# Patient Record
Sex: Female | Born: 1985 | Race: Black or African American | Hispanic: No | Marital: Single
Health system: Midwestern US, Community
[De-identification: ages and names within clinical notes are randomized; demographics above are authoritative.]

## PROBLEM LIST (undated history)

## (undated) DIAGNOSIS — I10 Essential (primary) hypertension: Secondary | ICD-10-CM

## (undated) DIAGNOSIS — K219 Gastro-esophageal reflux disease without esophagitis: Secondary | ICD-10-CM

## (undated) DIAGNOSIS — K3184 Gastroparesis: Secondary | ICD-10-CM

## (undated) DIAGNOSIS — K30 Functional dyspepsia: Secondary | ICD-10-CM

## (undated) HISTORY — DX: Functional dyspepsia: K30

---

## 2010-01-31 ENCOUNTER — Other Ambulatory Visit: Admission: RE | Admit: 2010-01-31 | Discharge: 2010-01-31 | Payer: Self-pay | Admitting: *Deleted

## 2010-04-18 ENCOUNTER — Ambulatory Visit: Payer: Self-pay | Admitting: Internal Medicine

## 2010-04-18 DIAGNOSIS — R030 Elevated blood-pressure reading, without diagnosis of hypertension: Secondary | ICD-10-CM

## 2010-04-18 DIAGNOSIS — R358 Other polyuria: Secondary | ICD-10-CM

## 2010-04-18 DIAGNOSIS — K219 Gastro-esophageal reflux disease without esophagitis: Secondary | ICD-10-CM | POA: Insufficient documentation

## 2010-04-18 LAB — CONVERTED CEMR LAB
Blood Glucose, Fingerstick: 130
Nitrite: NEGATIVE
Specific Gravity, Urine: 1.015
Urobilinogen, UA: 0.2
WBC Urine, dipstick: NEGATIVE

## 2010-07-11 NOTE — Assessment & Plan Note (Signed)
Summary: NEW PT EST // RS   Vital Signs:  Patient profile:   25 year old female Height:      64 inches Weight:      148 pounds BMI:     25.50 Temp:     98.3 degrees F oral BP sitting:   124 / 76  (left arm) Cuff size:   regular  Vitals Entered By: Duard Brady LPN (April 18, 2010 2:54 PM) CC: new to establish -- HTN concerns  Is Patient Diabetic? No CBG Result 130  Flu Vaccine Consent Questions     Do you have a history of severe allergic reactions to this vaccine? no    Any prior history of allergic reactions to egg and/or gelatin? no    Do you have a sensitivity to the preservative Thimersol? no    Do you have a past history of Guillan-Barre Syndrome? no    Do you currently have an acute febrile illness? no    Have you ever had a severe reaction to latex? no    Vaccine information given and explained to patient? yes    Are you currently pregnant? no    Lot Number:[id number]   Exp Date:12/09/2010   Site Given  Left Deltoid IM  CC:  new to establish -- HTN concerns .  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  He says been present for a few weeks.  She saw another physician 5 days ago, and a fasting blood sugar was 143.  She was given sampler Benicar due to a blood pressure of 170/120.  She took a single dose and has a monitoring of blood pressure readings.  There are somewhat labile, but often in a normal range.  She continues to have excessive thirst and frequency, including nocturia.  She is consuming considerable fluids due to excessive thirst.  No caffeinated products  Preventive Screening-Counseling & Management  Alcohol-Tobacco     Smoking Status: current  Allergies (verified): 1)  ! Keflex  Past History:  Past Medical History: rule out hypertension GERD history of UTI  Past Surgical History: gravida one, para zero, abortus 1, 2007  Family  History: Reviewed history and no changes required. father, age 30, hypertension mother age 78, hypertension, hypercholesterolemia maternal grandmother with diabetes one half brother in good health 4 sisters, one with sickle cell disease  Social History: Reviewed history and no changes required. 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    Physical Exam  General:  overweight-appearing.  blood pressure of 140/80 Head:  Normocephalic and atraumatic without obvious abnormalities. No apparent alopecia or balding. Eyes:  No corneal or conjunctival inflammation noted. EOMI. Perrla. Funduscopic exam benign, without hemorrhages, exudates or papilledema. Vision grossly normal. Ears:  External ear exam shows no significant lesions or deformities.  Otoscopic examination reveals clear canals, tympanic membranes are intact bilaterally without bulging, retraction, inflammation or discharge. Hearing is grossly normal bilaterally. Mouth:  Oral mucosa and oropharynx without lesions or exudates.  Teeth in good repair. Neck:  No deformities, masses, or tenderness noted. Lungs:  Normal respiratory effort, chest expands symmetrically. Lungs are clear to auscultation, no crackles or wheezes. Heart:  Normal rate and regular rhythm. S1 and S2 normal without gallop, murmur, click, rub or other extra sounds. Abdomen:  Bowel sounds  positive,abdomen soft and non-tender without masses, organomegaly or hernias noted. Msk:  No deformity or scoliosis noted of thoracic or lumbar spine.   Pulses:  R and L carotid,radial,femoral,dorsalis pedis and posterior tibial pulses are full and equal bilaterally Extremities:  No clubbing, cyanosis, edema, or deformity noted with normal full range of motion of all joints.   Skin:  Intact without suspicious lesions or rashes Cervical Nodes:  No lymphadenopathy noted Axillary Nodes:  No palpable lymphadenopathy Inguinal Nodes:  No significant adenopathy Psych:  Cognition and judgment appear intact. Alert and cooperative with normal attention span and concentration. No apparent delusions, illusions, hallucinations   Impression & Recommendations:  Problem # 1:  POLYURIA (GMW-102.72)  Orders: UA Dipstick W/ Micro (manual) (53664)  Problem # 2:  ELEVATED BLOOD PRESSURE WITHOUT DIAGNOSIS OF HYPERTENSION (ICD-796.2)  Complete Medication List: 1)  Ambien 10 Mg Tabs (Zolpidem tartrate) .... At bedtime prn  Other Orders: Admin 1st Vaccine (40347) Flu Vaccine 36yrs + (42595) Capillary Blood Glucose/CBG (63875)  Patient Instructions: 1)  moderate fluid intake 2)  Limit your Sodium (Salt). 3)  avoid caffeinated beverages 4)  Please schedule a follow-up appointment in 1 month. Prescriptions: AMBIEN 10 MG TABS (ZOLPIDEM TARTRATE) at bedtime prn  #30 x 2   Entered and Authorized by:   Gordy Savers  MD   Signed by:   Gordy Savers  MD on 04/18/2010   Method used:   Print then Give to Patient   RxID:   [id number]    Orders Added: 1)  Admin 1st Vaccine [90471] 2)  Flu Vaccine 49yrs + [30160] 3)  New Patient 18-39 years [99385] 4)  Capillary Blood Glucose/CBG [10932] 5)  UA Dipstick W/ Micro (manual) [81000]    Laboratory Results   Urine Tests  Date/Time Received: April 18, 2010 4:38 PM  Date/Time Reported: April 18, 2010 4:38 PM   Routine Urinalysis     Color: lt. yellow Appearance: Clear Glucose: negative   (Normal Range: Negative) Bilirubin: negative   (Normal Range: Negative) Ketone: negative   (Normal Range: Negative) Spec. Gravity: 1.015   (Normal Range: 1.003-1.035) Blood: trace-lysed   (Normal Range: Negative) pH: 5.0   (Normal Range: 5.0-8.0) Protein: trace   (Normal Range: Negative) Urobilinogen: 0.2   (Normal Range: 0-1) Nitrite: negative   (Normal Range: Negative) Leukocyte Esterace: negative   (Normal Range: Negative)     Blood Tests     CBG Random:: 130mg /dL

## 2011-02-15 ENCOUNTER — Other Ambulatory Visit: Payer: Self-pay | Admitting: Internal Medicine

## 2011-09-23 ENCOUNTER — Other Ambulatory Visit: Payer: Self-pay | Admitting: Internal Medicine

## 2013-01-03 ENCOUNTER — Encounter (HOSPITAL_COMMUNITY): Payer: Self-pay | Admitting: *Deleted

## 2013-01-03 ENCOUNTER — Emergency Department (HOSPITAL_COMMUNITY)
Admission: EM | Admit: 2013-01-03 | Discharge: 2013-01-03 | Disposition: A | Discharge status: Home / Self Care | Payer: Managed Care, Other (non HMO) | Attending: Emergency Medicine | Admitting: Emergency Medicine

## 2013-01-03 DIAGNOSIS — Z880 Allergy status to penicillin: Secondary | ICD-10-CM | POA: Insufficient documentation

## 2013-01-03 DIAGNOSIS — I10 Essential (primary) hypertension: Secondary | ICD-10-CM | POA: Insufficient documentation

## 2013-01-03 DIAGNOSIS — Z3202 Encounter for pregnancy test, result negative: Secondary | ICD-10-CM | POA: Insufficient documentation

## 2013-01-03 DIAGNOSIS — R112 Nausea with vomiting, unspecified: Secondary | ICD-10-CM | POA: Insufficient documentation

## 2013-01-03 DIAGNOSIS — R1013 Epigastric pain: Secondary | ICD-10-CM | POA: Insufficient documentation

## 2013-01-03 DIAGNOSIS — R630 Anorexia: Secondary | ICD-10-CM | POA: Insufficient documentation

## 2013-01-03 DIAGNOSIS — K219 Gastro-esophageal reflux disease without esophagitis: Secondary | ICD-10-CM | POA: Insufficient documentation

## 2013-01-03 DIAGNOSIS — Z87891 Personal history of nicotine dependence: Secondary | ICD-10-CM | POA: Insufficient documentation

## 2013-01-03 HISTORY — DX: Essential (primary) hypertension: I10

## 2013-01-03 HISTORY — DX: Gastro-esophageal reflux disease without esophagitis: K21.9

## 2013-01-03 LAB — COMPREHENSIVE METABOLIC PANEL
AST: 21 U/L (ref 0–37)
Albumin: 4.5 g/dL (ref 3.5–5.2)
Calcium: 9.8 mg/dL (ref 8.4–10.5)
Creatinine, Ser: 0.64 mg/dL (ref 0.50–1.10)
GFR calc non Af Amer: >90 mL/min (ref >90)
Total Protein: 7.6 g/dL (ref 6.0–8.3)

## 2013-01-03 LAB — POCT PREGNANCY, URINE: Preg Test, Ur: NEGATIVE

## 2013-01-03 LAB — URINALYSIS, ROUTINE W REFLEX MICROSCOPIC
Glucose, UA: NEGATIVE mg/dL
Leukocytes, UA: NEGATIVE
Protein, ur: NEGATIVE mg/dL
Specific Gravity, Urine: 1.02 (ref 1.005–1.030)
pH: 8.5 — ABNORMAL HIGH (ref 5.0–8.0)

## 2013-01-03 LAB — CBC WITH DIFFERENTIAL/PLATELET
Basophils Absolute: 0 10*3/uL (ref 0.0–0.1)
Basophils Relative: 1 % (ref 0–1)
Eosinophils Absolute: 0 10*3/uL (ref 0.0–0.7)
Eosinophils Relative: 0 % (ref 0–5)
HCT: 42 % (ref 36.0–46.0)
MCHC: 33.8 g/dL (ref 30.0–36.0)
MCV: 84.5 fL (ref 78.0–100.0)
Monocytes Absolute: 0.3 10*3/uL (ref 0.1–1.0)
Platelets: 279 10*3/uL (ref 150–400)
RDW: 13 % (ref 11.5–15.5)

## 2013-01-03 MED ORDER — OMEPRAZOLE 20 MG PO CPDR: 20.0000 mg | DELAYED_RELEASE_CAPSULE | Freq: Every day | ORAL | Status: DC

## 2013-01-03 MED ORDER — HYDROMORPHONE HCL PF 1 MG/ML IJ SOLN
1.0000 mg | Freq: Once | INTRAMUSCULAR | Status: AC
  Administered 2013-01-03: 1 mg via INTRAVENOUS
  Filled 2013-01-03: qty 1

## 2013-01-03 MED ORDER — ONDANSETRON HCL 4 MG/2ML IJ SOLN
4.0000 mg | Freq: Once | INTRAMUSCULAR | Status: AC
  Administered 2013-01-03: 4 mg via INTRAVENOUS
  Filled 2013-01-03: qty 2

## 2013-01-03 MED ORDER — PANTOPRAZOLE SODIUM 40 MG PO TBEC
40.0000 mg | DELAYED_RELEASE_TABLET | Freq: Once | ORAL | Status: AC
  Administered 2013-01-03: 40 mg via ORAL
  Filled 2013-01-03: qty 1

## 2013-01-03 MED ORDER — SODIUM CHLORIDE 0.9 % IV SOLN
1000.0000 mL | INTRAVENOUS | Status: DC
  Administered 2013-01-03: 1000 mL via INTRAVENOUS

## 2013-01-03 MED ORDER — ONDANSETRON HCL 8 MG PO TABS: 4.0000 mg | ORAL_TABLET | ORAL | Status: DC | PRN

## 2013-01-03 NOTE — ED Provider Notes (Signed)
CSN: [id number]     Arrival date & time 01/03/13  1507 History     First MD Initiated Contact with Patient 01/03/13 1628     Chief Complaint  Patient presents with  . Abdominal Pain   (Consider location/radiation/quality/duration/timing/severity/associated sxs/prior Treatment) Patient is a 27 y.o. female presenting with abdominal pain. The history is provided by the patient, a friend and medical records. No language interpreter was used.  Abdominal Pain Associated symptoms include abdominal pain, nausea and vomiting. Pertinent negatives include no chest pain, coughing, diaphoresis, fatigue, fever, neck pain, rash or weakness.    Karen Herrera is a 27 y.o. female  with a hx of GERD and HTN on no home medications presents to the Emergency Department complaining of gradual, persistent, progressively worsening sharp epigastric abdominal pain with associated nausea and vomiting beginning yesterday morning. Pt states she has had several episodes like this in the past, but none as bad as the one she is currently having.  Pt denies any correlation with her menses.  LMP was 2 weeks ago. Associated symptoms include decreased appetite and PO intake.  She has not attempted any home medications, but nothing seems to make it better or worse.  Pt denies fever, chills, headache, neck pain, chest pain, SOB, diarrhea, weakness, dizziness, syncope, dysuria, hematuria.     Past Medical History  Diagnosis Date  . GERD (gastroesophageal reflux disease)   . Hypertension    History reviewed. No pertinent past surgical history. No family history on file. History  Substance Use Topics  . Smoking status: Former Smoker    Quit date: 06/22/2011  . Smokeless tobacco: Not on file  . Alcohol Use: Yes   OB History   Grav Para Term Preterm Abortions TAB SAB Ect Mult Living                 Review of Systems  Constitutional: Negative for fever, diaphoresis, appetite change, fatigue and unexpected weight change.    HENT: Negative for mouth sores, trouble swallowing, neck pain and neck stiffness.   Respiratory: Negative for cough, chest tightness, shortness of breath, wheezing and stridor.   Cardiovascular: Negative for chest pain and palpitations.  Gastrointestinal: Positive for nausea, vomiting and abdominal pain. Negative for diarrhea, constipation, blood in stool, abdominal distention and rectal pain.  Genitourinary: Negative for dysuria, urgency, frequency, hematuria, flank pain and difficulty urinating.  Musculoskeletal: Negative for back pain.  Skin: Negative for rash.  Neurological: Negative for weakness.  Hematological: Negative for adenopathy.  Psychiatric/Behavioral: Negative for confusion.  All other systems reviewed and are negative.    Allergies  Cephalexin  Home Medications   Current Outpatient Rx  Name  Route  Sig  Dispense  Refill  . omeprazole (PRILOSEC) 20 MG capsule   Oral   Take 1 capsule (20 mg total) by mouth daily.   30 capsule   0   . ondansetron (ZOFRAN) 8 MG tablet   Oral   Take 0.5 tablets (4 mg total) by mouth every 4 (four) hours as needed for nausea.   10 tablet   0    BP 154/100  Pulse 83  Temp(Src) 98.7 F (37.1 C) (Oral)  Resp 18  SpO2 99%  LMP 12/20/2012 Physical Exam  Nursing note and vitals reviewed. Constitutional: She is oriented to person, place, and time. She appears well-developed and well-nourished.  Patient vomiting throughout most of exam  HENT:  Head: Normocephalic and atraumatic.  Right Ear: Tympanic membrane, external ear and ear canal normal.  Left Ear: Tympanic membrane, external ear and ear canal normal.  Nose: Nose normal. No mucosal edema or rhinorrhea.  Mouth/Throat: Uvula is midline, oropharynx is clear and moist and mucous membranes are normal. Mucous membranes are not dry. No edematous. No oropharyngeal exudate, posterior oropharyngeal edema, posterior oropharyngeal erythema or tonsillar abscesses.  Eyes: Conjunctivae  are normal. Pupils are equal, round, and reactive to light. No scleral icterus.  Neck: Normal range of motion. Neck supple.  Cardiovascular: Normal rate, regular rhythm, normal heart sounds and intact distal pulses.   No murmur heard. Pulmonary/Chest: Effort normal and breath sounds normal. No respiratory distress. She has no wheezes. She has no rales. She exhibits no tenderness.  Abdominal: Soft. Bowel sounds are normal. She exhibits no distension and no mass. There is tenderness in the epigastric area. There is guarding. There is no rigidity, no rebound, no CVA tenderness and negative Murphy's sign.    No tenderness to RUQ; negative Murphy's sign  Musculoskeletal: Normal range of motion. She exhibits no edema and no tenderness.  Lymphadenopathy:    She has no cervical adenopathy.  Neurological: She is alert and oriented to person, place, and time. She exhibits normal muscle tone. Coordination normal.  Skin: Skin is warm and dry. No erythema.  Psychiatric: She has a normal mood and affect.    ED Course   Procedures (including critical care time)  Labs Reviewed  COMPREHENSIVE METABOLIC PANEL - Abnormal; Notable for the following:    Glucose, Bld 114 (*)    All other components within normal limits  URINALYSIS, ROUTINE W REFLEX MICROSCOPIC - Abnormal; Notable for the following:    APPearance CLOUDY (*)    pH 8.5 (*)    Ketones, ur 40 (*)    All other components within normal limits  CBC WITH DIFFERENTIAL  LIPASE, BLOOD  POCT PREGNANCY, URINE   No results found. 1. Epigastric pain   2. GERD     MDM  Karen Herrera presents with epigastric abd pain, N/V.  Exam with significant tenderness to the epigastrium on initial exam, though somewhat limited 2/2 persistent vomiting.  Will obtain labs and treat symptoms.  7:06 PM Pt with complete resolution of abd pain, nausea and vomiting after administration of zofran and dilaudid. Patient states she feels back to normal.  Patient abdomen  soft and nontender on reevaluation. Patient given protonix to treat for possible PUD.  Patient is nontoxic, nonseptic appearing, in no apparent distress.  Patient's pain and other symptoms adequately managed in emergency department.  Fluid bolus given.  Labs and vitals reviewed.  Patient does not meet the SIRS or Sepsis criteria.  On repeat exam patient does not have a surgical abdomin and there are no peritoneal signs.  No indication of appendicitis, bowel obstruction, bowel perforation, cholecystitis, diverticulitis or ectopic pregnancy.  Patient discharged home with symptomatic treatment and given strict instructions for follow-up with their primary care physician and gastroenterology.  I have also discussed reasons to return immediately to the ER.  Patient expresses understanding and agrees with plan.     Dierdre Forth, PA-C 01/03/13 1908

## 2013-01-03 NOTE — ED Notes (Signed)
Pt reports having abdominal pain with nausea and vomiting since yesterday. Pt reports getting episodes of n/v, and abd.pain every few weeks.

## 2013-01-04 ENCOUNTER — Encounter (HOSPITAL_COMMUNITY): Payer: Self-pay | Admitting: Emergency Medicine

## 2013-01-04 ENCOUNTER — Emergency Department (HOSPITAL_COMMUNITY)
Admission: EM | Admit: 2013-01-04 | Discharge: 2013-01-04 | Disposition: A | Discharge status: Home / Self Care | Payer: Managed Care, Other (non HMO) | Attending: Emergency Medicine | Admitting: Emergency Medicine

## 2013-01-04 ENCOUNTER — Emergency Department (HOSPITAL_COMMUNITY): Payer: Managed Care, Other (non HMO)

## 2013-01-04 DIAGNOSIS — N83209 Unspecified ovarian cyst, unspecified side: Secondary | ICD-10-CM | POA: Insufficient documentation

## 2013-01-04 DIAGNOSIS — K219 Gastro-esophageal reflux disease without esophagitis: Secondary | ICD-10-CM | POA: Insufficient documentation

## 2013-01-04 DIAGNOSIS — Z79899 Other long term (current) drug therapy: Secondary | ICD-10-CM | POA: Insufficient documentation

## 2013-01-04 DIAGNOSIS — Z87891 Personal history of nicotine dependence: Secondary | ICD-10-CM | POA: Insufficient documentation

## 2013-01-04 DIAGNOSIS — R1013 Epigastric pain: Secondary | ICD-10-CM | POA: Insufficient documentation

## 2013-01-04 DIAGNOSIS — R112 Nausea with vomiting, unspecified: Secondary | ICD-10-CM | POA: Insufficient documentation

## 2013-01-04 DIAGNOSIS — I10 Essential (primary) hypertension: Secondary | ICD-10-CM | POA: Insufficient documentation

## 2013-01-04 DIAGNOSIS — R079 Chest pain, unspecified: Secondary | ICD-10-CM | POA: Insufficient documentation

## 2013-01-04 LAB — COMPREHENSIVE METABOLIC PANEL
ALT: 12 U/L (ref 0–35)
Albumin: 4.6 g/dL (ref 3.5–5.2)
Alkaline Phosphatase: 67 U/L (ref 39–117)
Glucose, Bld: 107 mg/dL — ABNORMAL HIGH (ref 70–99)
Potassium: 3.2 mEq/L — ABNORMAL LOW (ref 3.5–5.1)
Sodium: 136 mEq/L (ref 135–145)
Total Protein: 7.6 g/dL (ref 6.0–8.3)

## 2013-01-04 LAB — CBC
Hemoglobin: 14.1 g/dL (ref 12.0–15.0)
MCHC: 33.7 g/dL (ref 30.0–36.0)
RDW: 12.8 % (ref 11.5–15.5)

## 2013-01-04 MED ORDER — DIPHENHYDRAMINE HCL 50 MG/ML IJ SOLN
25.0000 mg | Freq: Once | INTRAMUSCULAR | Status: AC
  Administered 2013-01-04: 25 mg via INTRAVENOUS
  Filled 2013-01-04: qty 1

## 2013-01-04 MED ORDER — HYDROMORPHONE HCL PF 1 MG/ML IJ SOLN
1.0000 mg | Freq: Once | INTRAMUSCULAR | Status: AC
  Administered 2013-01-04: 1 mg via INTRAVENOUS
  Filled 2013-01-04: qty 1

## 2013-01-04 MED ORDER — SODIUM CHLORIDE 0.9 % IV BOLUS (SEPSIS)
1000.0000 mL | Freq: Once | INTRAVENOUS | Status: AC
  Administered 2013-01-04: 1000 mL via INTRAVENOUS

## 2013-01-04 MED ORDER — POTASSIUM CHLORIDE CRYS ER 20 MEQ PO TBCR
40.0000 meq | EXTENDED_RELEASE_TABLET | Freq: Once | ORAL | Status: AC
  Administered 2013-01-04: 40 meq via ORAL
  Filled 2013-01-04: qty 2

## 2013-01-04 MED ORDER — ONDANSETRON 8 MG PO TBDP: ORAL_TABLET | ORAL | Status: DC

## 2013-01-04 MED ORDER — OXYCODONE-ACETAMINOPHEN 5-325 MG PO TABS: 1.0000 | ORAL_TABLET | ORAL | Status: DC | PRN

## 2013-01-04 MED ORDER — IOHEXOL 300 MG/ML  SOLN
100.0000 mL | Freq: Once | INTRAMUSCULAR | Status: AC | PRN
  Administered 2013-01-04: 100 mL via INTRAVENOUS

## 2013-01-04 MED ORDER — METOCLOPRAMIDE HCL 5 MG/ML IJ SOLN
10.0000 mg | Freq: Once | INTRAMUSCULAR | Status: AC
  Administered 2013-01-04: 10 mg via INTRAVENOUS
  Filled 2013-01-04: qty 2

## 2013-01-04 MED ORDER — ONDANSETRON HCL 4 MG/2ML IJ SOLN
4.0000 mg | Freq: Once | INTRAMUSCULAR | Status: AC
  Administered 2013-01-04: 4 mg via INTRAVENOUS
  Filled 2013-01-04: qty 2

## 2013-01-04 MED ORDER — IOHEXOL 300 MG/ML  SOLN
50.0000 mL | Freq: Once | INTRAMUSCULAR | Status: AC | PRN
  Administered 2013-01-04: 50 mL via ORAL

## 2013-01-04 NOTE — ED Notes (Signed)
Medications ordered after pt placed for discharged.

## 2013-01-04 NOTE — ED Provider Notes (Signed)
Medical screening examination/treatment/procedure(s) were performed by non-physician practitioner and as supervising physician I was immediately available for consultation/collaboration.  Hurman Horn, MD 01/04/13 1155

## 2013-01-04 NOTE — ED Provider Notes (Signed)
CSN: [id number]     Arrival date & time 01/04/13  1436 History     First MD Initiated Contact with Patient 01/04/13 1510     Chief Complaint  Patient presents with  . Abdominal Pain   (Consider location/radiation/quality/duration/timing/severity/associated sxs/prior Treatment) The history is provided by the patient and medical records. No language interpreter was used.    Karen Herrera is a 27 y.o. female  with a hx of HTN, GERD presents to the Emergency Department complaining of gradual, persistent, progressively worsening epigastric pain recurring again this morning with assocaited N/V and radiating into the chest.  Pt seen last night for similar symptoms which resolved with fluid, pain medication.  Labs unremarkable at that time.  No CT last night due to quick and complete resolution of pain with toleration of PO for several hours prior to discharge.  Pt states no return of symptoms last night.   Associated symptoms include nausea and vomiting without diarrhea.  Nothing makes it better including the zofran and prevacid tried this morning and eating makes it worse.  Pt denies fever, chills, headache, neck pain, shortness of breath, diarrhea, weakness, dizziness, syncope, dysuria.     Past Medical History  Diagnosis Date  . GERD (gastroesophageal reflux disease)   . Hypertension    History reviewed. No pertinent past surgical history. History reviewed. No pertinent family history. History  Substance Use Topics  . Smoking status: Former Smoker    Quit date: 06/22/2011  . Smokeless tobacco: Not on file  . Alcohol Use: Yes   OB History   Grav Para Term Preterm Abortions TAB SAB Ect Mult Living                 Review of Systems  Constitutional: Negative for fever, diaphoresis, appetite change, fatigue and unexpected weight change.  HENT: Negative for mouth sores and neck stiffness.   Eyes: Negative for visual disturbance.  Respiratory: Negative for cough, chest tightness, shortness  of breath and wheezing.   Cardiovascular: Positive for chest pain.  Gastrointestinal: Positive for nausea, vomiting and abdominal pain. Negative for diarrhea and constipation.  Endocrine: Negative for polydipsia, polyphagia and polyuria.  Genitourinary: Negative for dysuria, urgency, frequency and hematuria.  Musculoskeletal: Negative for back pain.  Skin: Negative for rash.  Allergic/Immunologic: Negative for immunocompromised state.  Neurological: Negative for syncope, light-headedness and headaches.  Hematological: Does not bruise/bleed easily.  Psychiatric/Behavioral: Negative for sleep disturbance. The patient is not nervous/anxious.     Allergies  Cephalexin  Home Medications   Current Outpatient Rx  Name  Route  Sig  Dispense  Refill  . omeprazole (PRILOSEC) 20 MG capsule   Oral   Take 1 capsule (20 mg total) by mouth daily.   30 capsule   0   . ondansetron (ZOFRAN) 8 MG tablet   Oral   Take 0.5 tablets (4 mg total) by mouth every 4 (four) hours as needed for nausea.   10 tablet   0   . ondansetron (ZOFRAN ODT) 8 MG disintegrating tablet      8mg  ODT q4 hours prn nausea   10 tablet   0   . oxyCODONE-acetaminophen (PERCOCET/ROXICET) 5-325 MG per tablet   Oral   Take 1-2 tablets by mouth every 4 (four) hours as needed for pain.   15 tablet   0    BP 128/70  Pulse 76  Temp(Src) 98.5 F (36.9 C) (Oral)  Resp 20  SpO2 100%  LMP 12/20/2012 Physical Exam  Nursing  note and vitals reviewed. Constitutional: She appears well-developed and well-nourished.  HENT:  Head: Normocephalic and atraumatic.  Mouth/Throat: Oropharynx is clear and moist.  Eyes: Conjunctivae are normal. Pupils are equal, round, and reactive to light. No scleral icterus.  Neck: Normal range of motion.  Cardiovascular: Normal rate, regular rhythm, normal heart sounds and intact distal pulses.   No murmur heard. Pulmonary/Chest: Effort normal and breath sounds normal. No respiratory  distress. She has no wheezes.  Abdominal: Soft. Normal appearance and bowel sounds are normal. She exhibits no distension, no pulsatile midline mass and no mass. There is tenderness in the epigastric area. There is guarding. There is no rigidity, no rebound, no CVA tenderness and negative Murphy's sign.    Lymphadenopathy:    She has no cervical adenopathy.  Neurological: She is alert.  Skin: Skin is warm and dry. No erythema.  Psychiatric: She has a normal mood and affect.    ED Course   Procedures (including critical care time)  Labs Reviewed  COMPREHENSIVE METABOLIC PANEL - Abnormal; Notable for the following:    Potassium 3.2 (*)    Glucose, Bld 107 (*)    BUN 5 (*)    All other components within normal limits  CBC  LIPASE, BLOOD  POCT I-STAT TROPONIN I   ECG:  Date: 01/04/2013  Rate: 78  Rhythm: normal sinus rhythm  QRS Axis: normal  Intervals: normal  ST/T Wave abnormalities: normal  Conduction Disutrbances:none  Narrative Interpretation: nonischemic, no old for comparison  Old EKG Reviewed: none available    Ct Abdomen Pelvis W Contrast  01/04/2013   *RADIOLOGY REPORT*  Clinical Data: Progressive epigastric abdominal pain with nausea and vomiting.  CT ABDOMEN AND PELVIS WITH CONTRAST  Technique:  Multidetector CT imaging of the abdomen and pelvis was performed following the standard protocol during bolus administration of intravenous contrast.  Contrast: 50mL OMNIPAQUE IOHEXOL 300 MG/ML  SOLN, OMNIPAQUE IOHEXOL 300 MG/ML  SOLN  Comparison: None.  Findings: There is hepatomegaly with a slight inhomogeneity of the anterior aspect of the left lobe of the liver without focal lesion. Biliary tree is normal.  Spleen, pancreas, adrenal glands, and kidneys are normal except for a 13 mm cyst on the upper pole of the right kidney.  The bowel is normal including the terminal ileum and appendix.  There is a small amount of ascites in the inferior aspect of both pericolic  gutters as well as in the pelvic cul-de-sac.  There is a partially collapsed 18 mm cyst on the right ovary.  This may be the source of the free fluid.  Uterus and left ovary are normal.  No osseous abnormalities.  IMPRESSION:  1.  Hepatomegaly with slight inhomogeneous the liver parenchyma, nonspecific. 2.  Free fluid in the pelvis with a partially collapsed right ovarian cyst.   Original Report Authenticated By: Francene Boyers, M.D.   1. Epigastric abdominal pain   2. Ruptured ovarian cyst     MDM  Inaya Guertin presents to the ED < 24 hours after d/c last night with persistent N/V and epigastric pain.  Pt TTP of the epigastrium specifically and mildly tender throughout without peritoneal signs.  Pt actively vomiting on initial exam.  Will give fluids, antiemetics and pain control.    4:46 PM Pt with cessation of nausea, vomiting and pain.  Mild hypokalemia but otherwise unchanged and unremarkable from last night.  ECG nonischemic and troponin negative.  CT scan pending.   6:50PM CT scan with 18mm  ruptured right ovarian cyst with small amount free fluid in the paracolic gutters and pelvic cul-de-sac.  I personally reviewed the imaging tests through PACS system.  I reviewed available ER/hospitalization records through the EMR.  This is a potential cause for the patient's persistent nausea and vomiting, but unlikely a cause for the epigastric pain.    Patient is nontoxic, nonseptic appearing, in no apparent distress.  Patient's pain and other symptoms adequately managed in emergency department.  Fluid bolus given.  Labs, imaging and vitals reviewed.  Patient does not meet the SIRS or Sepsis criteria.  On repeat exam patient does not have a surgical abdomin and there are no peritoneal signs.  No indication of appendicitis, bowel obstruction, bowel perforation, cholecystitis, diverticulitis or ectopic pregnancy.  Patient discharged home with symptomatic treatment and given strict instructions for follow-up  with their primary care physician, gastroenterology and OB/GYN.  I have also discussed reasons to return immediately to the ER.  Patient expresses understanding and agrees with plan.  Dr. Denton Lank was consulted, evaluated this patient with me and agrees with the plan.        Dahlia Client Loveah Like, PA-C 01/04/13 1937

## 2013-01-04 NOTE — ED Notes (Signed)
Was here yesterday for abdominal pain and nausea and vomiting, nauseated since Saturday, was given prilosec and zofran but unable to keep down, pt crying and moaning

## 2013-01-06 NOTE — ED Provider Notes (Signed)
Medical screening examination/treatment/procedure(s) were performed by non-physician practitioner and as supervising physician I was immediately available for consultation/collaboration.   Suzi Roots, MD 01/06/13 [phone]

## 2013-01-08 ENCOUNTER — Ambulatory Visit: Payer: Managed Care, Other (non HMO) | Admitting: Internal Medicine

## 2013-01-09 HISTORY — PX: UPPER GI ENDOSCOPY: SHX6162

## 2013-01-18 ENCOUNTER — Encounter (HOSPITAL_COMMUNITY): Payer: Self-pay | Admitting: *Deleted

## 2013-01-18 ENCOUNTER — Emergency Department (HOSPITAL_COMMUNITY)
Admission: EM | Admit: 2013-01-18 | Discharge: 2013-01-18 | Disposition: A | Discharge status: Home / Self Care | Payer: Managed Care, Other (non HMO) | Attending: Emergency Medicine | Admitting: Emergency Medicine

## 2013-01-18 ENCOUNTER — Emergency Department (HOSPITAL_COMMUNITY): Payer: Managed Care, Other (non HMO)

## 2013-01-18 DIAGNOSIS — Z3202 Encounter for pregnancy test, result negative: Secondary | ICD-10-CM | POA: Insufficient documentation

## 2013-01-18 DIAGNOSIS — Z79899 Other long term (current) drug therapy: Secondary | ICD-10-CM | POA: Insufficient documentation

## 2013-01-18 DIAGNOSIS — Z87891 Personal history of nicotine dependence: Secondary | ICD-10-CM | POA: Insufficient documentation

## 2013-01-18 DIAGNOSIS — R109 Unspecified abdominal pain: Secondary | ICD-10-CM | POA: Insufficient documentation

## 2013-01-18 DIAGNOSIS — R197 Diarrhea, unspecified: Secondary | ICD-10-CM | POA: Insufficient documentation

## 2013-01-18 DIAGNOSIS — R112 Nausea with vomiting, unspecified: Secondary | ICD-10-CM

## 2013-01-18 DIAGNOSIS — I10 Essential (primary) hypertension: Secondary | ICD-10-CM | POA: Insufficient documentation

## 2013-01-18 DIAGNOSIS — K219 Gastro-esophageal reflux disease without esophagitis: Secondary | ICD-10-CM | POA: Insufficient documentation

## 2013-01-18 DIAGNOSIS — E860 Dehydration: Secondary | ICD-10-CM | POA: Insufficient documentation

## 2013-01-18 LAB — URINALYSIS, ROUTINE W REFLEX MICROSCOPIC
Glucose, UA: NEGATIVE mg/dL
Leukocytes, UA: NEGATIVE
Nitrite: NEGATIVE
Protein, ur: 30 mg/dL — AB
pH: 6 (ref 5.0–8.0)

## 2013-01-18 LAB — CBC
MCH: 28.6 pg (ref 26.0–34.0)
MCHC: 34.3 g/dL (ref 30.0–36.0)
MCV: 83.5 fL (ref 78.0–100.0)
Platelets: 290 10*3/uL (ref 150–400)
RDW: 12.8 % (ref 11.5–15.5)

## 2013-01-18 LAB — URINE MICROSCOPIC-ADD ON

## 2013-01-18 LAB — COMPREHENSIVE METABOLIC PANEL
AST: 19 U/L (ref 0–37)
Albumin: 4.6 g/dL (ref 3.5–5.2)
Calcium: 9.7 mg/dL (ref 8.4–10.5)
Creatinine, Ser: 0.88 mg/dL (ref 0.50–1.10)

## 2013-01-18 LAB — PREGNANCY, URINE: Preg Test, Ur: NEGATIVE

## 2013-01-18 MED ORDER — LORAZEPAM 2 MG/ML IJ SOLN
1.0000 mg | Freq: Once | INTRAMUSCULAR | Status: AC
  Administered 2013-01-18: 1 mg via INTRAVENOUS
  Filled 2013-01-18: qty 1

## 2013-01-18 MED ORDER — SODIUM CHLORIDE 0.9 % IV SOLN
1000.0000 mL | INTRAVENOUS | Status: DC
  Administered 2013-01-18 (×2): 1000 mL via INTRAVENOUS

## 2013-01-18 MED ORDER — SODIUM CHLORIDE 0.9 % IV SOLN
1000.0000 mL | Freq: Once | INTRAVENOUS | Status: AC
  Administered 2013-01-18: 1000 mL via INTRAVENOUS

## 2013-01-18 MED ORDER — METOCLOPRAMIDE HCL 5 MG/ML IJ SOLN
10.0000 mg | Freq: Once | INTRAMUSCULAR | Status: AC
  Administered 2013-01-18: 10 mg via INTRAVENOUS
  Filled 2013-01-18: qty 2

## 2013-01-18 MED ORDER — PROMETHAZINE HCL 25 MG RE SUPP: 25.0000 mg | Freq: Four times a day (QID) | RECTAL | Status: DC | PRN

## 2013-01-18 MED ORDER — OXYCODONE-ACETAMINOPHEN 5-325 MG PO TABS: 1.0000 | ORAL_TABLET | ORAL | Status: DC | PRN

## 2013-01-18 MED ORDER — MORPHINE SULFATE 4 MG/ML IJ SOLN
6.0000 mg | Freq: Once | INTRAMUSCULAR | Status: AC
  Administered 2013-01-18: 6 mg via INTRAVENOUS
  Filled 2013-01-18 (×2): qty 1

## 2013-01-18 MED ORDER — METOCLOPRAMIDE HCL 10 MG PO TABS: 10.0000 mg | ORAL_TABLET | Freq: Four times a day (QID) | ORAL | Status: DC

## 2013-01-18 NOTE — ED Notes (Signed)
Patient acknowledges having HTN and does not take her medications. Instructed to fu with her MD.

## 2013-01-18 NOTE — ED Provider Notes (Addendum)
CSN: [id number]     Arrival date & time 01/18/13  6578 History     First MD Initiated Contact with Patient 01/18/13 [phone]     Chief Complaint  Patient presents with  . Abdominal Pain    HPI  patient reports history of recurrent abdominal pain over the last couple weeks.  She saw her gastroenterologist in [facility] on Friday and was started on dexilant and reports no improvement in her symptoms.  She's continued to have nausea and vomiting with one loose stool today.  She reports her upper abdominal pain is moderate in severity and crampy in nature.  Her chest pain shortness of breath.  No fevers or chills.  No urinary complaints.  No flank pain.  No vaginal complaints.  At this time she still does not have a diagnosis from her gastroenterologist regarding her recurrent upper abdominal pain nausea and vomiting.  She underwent endoscopy on Friday without complications and without a definitive diagnosis.  She reports 3 biopsies were obtained.   Past Medical History  Diagnosis Date  . GERD (gastroesophageal reflux disease)   . Hypertension    History reviewed. No pertinent past surgical history. History reviewed. No pertinent family history. History  Substance Use Topics  . Smoking status: Former Smoker    Quit date: 06/22/2011  . Smokeless tobacco: Not on file  . Alcohol Use: Yes   OB History   Grav Para Term Preterm Abortions TAB SAB Ect Mult Living                 Review of Systems  All other systems reviewed and are negative.    Allergies  Cephalexin  Home Medications   Current Outpatient Rx  Name  Route  Sig  Dispense  Refill  . dexlansoprazole (DEXILANT) 60 MG capsule   Oral   Take 60 mg by mouth daily.         Marland Kitchen omeprazole (PRILOSEC) 20 MG capsule   Oral   Take 1 capsule (20 mg total) by mouth daily.   30 capsule   0   . ondansetron (ZOFRAN) 8 MG tablet   Oral   Take 8 mg by mouth every 8 (eight) hours as needed for nausea.         .  ondansetron (ZOFRAN-ODT) 8 MG disintegrating tablet   Oral   Take 8 mg by mouth every 8 (eight) hours as needed for nausea.         Marland Kitchen oxyCODONE-acetaminophen (PERCOCET/ROXICET) 5-325 MG per tablet   Oral   Take 1-2 tablets by mouth every 4 (four) hours as needed for pain.   15 tablet   0   . promethazine (PHENERGAN) 25 MG tablet   Oral   Take 25 mg by mouth every 6 (six) hours as needed for nausea.         . sucralfate (CARAFATE) 1 GM/10ML suspension   Oral   Take 1 g by mouth 4 (four) times daily.          BP 152/99  Pulse 101  Temp(Src) 99.3 F (37.4 C) (Oral)  Resp 22  SpO2 100%  LMP 12/20/2012 Physical Exam  Nursing note and vitals reviewed. Constitutional: She is oriented to person, place, and time. She appears well-developed and well-nourished. No distress.  HENT:  Head: Normocephalic and atraumatic.  Eyes: EOM are normal.  Neck: Normal range of motion.  Cardiovascular: Normal rate, regular rhythm and normal heart sounds.   Pulmonary/Chest: Effort normal and  breath sounds normal.  Abdominal: Soft. She exhibits no distension.  Upper abdominal tenderness without significant guarding or rebound.  No right upper quadrant tenderness  Musculoskeletal: Normal range of motion.  Neurological: She is alert and oriented to person, place, and time.  Skin: Skin is warm and dry.  Psychiatric: She has a normal mood and affect. Judgment normal.    ED Course   Procedures (including critical care time)  Labs Reviewed  CBC - Abnormal; Notable for the following:    RBC 5.21 (*)    All other components within normal limits  COMPREHENSIVE METABOLIC PANEL - Abnormal; Notable for the following:    Chloride 95 (*)    All other components within normal limits  LIPASE, BLOOD - Abnormal; Notable for the following:    Lipase 117 (*)    All other components within normal limits  URINALYSIS, ROUTINE W REFLEX MICROSCOPIC - Abnormal; Notable for the following:    Color, Urine  AMBER (*)    APPearance CLOUDY (*)    Specific Gravity, Urine 1.039 (*)    Hgb urine dipstick MODERATE (*)    Bilirubin Urine SMALL (*)    Ketones, ur >80 (*)    Protein, ur 30 (*)    All other components within normal limits  URINE MICROSCOPIC-ADD ON - Abnormal; Notable for the following:    Bacteria, UA FEW (*)    All other components within normal limits  PREGNANCY, URINE   No results found. 1. Abdominal pain   2. Nausea & vomiting   3. Dehydration     MDM  12:07 PM Patient feels much better this time.  Discharge home in good condition.  She has nausea medicine at home.  I'll at Reglan as well as suppository Phenergan.  She has pain medications at home.  She does have mild elevation in her lipase today to 117.  This could represent mild pancreatitis but because she feels so much better at this time she'll be sent home with gastroenterology followup.  Instructed her to clear diet for the next 24 hours.  She understands return to the ER for new or worsening symptoms.  A copy of her records including her laboratory studies were given to the patient so that when she follows up with her gastroenterologist he/she will have updated labs  Lyanne Co, MD 01/18/13 1210  1:03 PM At time of discharge the patient had an acute recurrence or upper abdominal pain.  Given her acute severity of pain consideration for cholelithiasis his had given her elevation of lipase.  Ultrasound pending at this time.  I discussed her case with her gastroenterologist at [facility].  She agrees with obtaining the ultrasound at this time.  If her ultrasound demonstrates no acute pathology the patient will followup closely with her gastroenterologist in [place].  Lyanne Co, MD 01/18/13 1304  2:33 PM Patient is feeling better again.  Ultrasound without significant abnormality.  Outpatient followup regarding the nodular irregularities of the left lobe of the liver.   Outpatient gastroenterology followup.  Lyanne Co, MD 01/18/13 [phone]

## 2013-01-18 NOTE — ED Notes (Signed)
Patient throwing up every few hours. 5X just last night. Emesis was yellow now green in color. Last BM was this morning in which she had diarrhea. Constantly nauseous

## 2013-01-18 NOTE — ED Notes (Signed)
Pt reports seen in ED 7/26, 7/27 for same. Abdominal pain, nausea and vomiting x4 days. Pain 10/10. Reports had endoscopy on Friday, waiting for results. Reports phenergan is not helping.

## 2013-01-18 NOTE — ED Notes (Signed)
Patient states she was feeling better. Sleeping intermittently

## 2013-02-19 ENCOUNTER — Encounter: Payer: Self-pay | Admitting: Internal Medicine

## 2013-02-20 ENCOUNTER — Emergency Department (HOSPITAL_COMMUNITY)
Admission: EM | Admit: 2013-02-20 | Discharge: 2013-02-20 | Disposition: A | Discharge status: Home / Self Care | Payer: Managed Care, Other (non HMO) | Attending: Emergency Medicine | Admitting: Emergency Medicine

## 2013-02-20 ENCOUNTER — Encounter (HOSPITAL_COMMUNITY): Payer: Self-pay | Admitting: Family Medicine

## 2013-02-20 DIAGNOSIS — R109 Unspecified abdominal pain: Secondary | ICD-10-CM | POA: Insufficient documentation

## 2013-02-20 DIAGNOSIS — Z3202 Encounter for pregnancy test, result negative: Secondary | ICD-10-CM | POA: Insufficient documentation

## 2013-02-20 DIAGNOSIS — Z87891 Personal history of nicotine dependence: Secondary | ICD-10-CM | POA: Insufficient documentation

## 2013-02-20 DIAGNOSIS — R112 Nausea with vomiting, unspecified: Secondary | ICD-10-CM | POA: Insufficient documentation

## 2013-02-20 DIAGNOSIS — Z79899 Other long term (current) drug therapy: Secondary | ICD-10-CM | POA: Insufficient documentation

## 2013-02-20 DIAGNOSIS — K219 Gastro-esophageal reflux disease without esophagitis: Secondary | ICD-10-CM | POA: Insufficient documentation

## 2013-02-20 DIAGNOSIS — I10 Essential (primary) hypertension: Secondary | ICD-10-CM | POA: Insufficient documentation

## 2013-02-20 LAB — CBC WITH DIFFERENTIAL/PLATELET
Basophils Relative: 0 % (ref 0–1)
Eosinophils Absolute: 0 10*3/uL (ref 0.0–0.7)
Eosinophils Relative: 0 % (ref 0–5)
Hemoglobin: 13.2 g/dL (ref 12.0–15.0)
MCH: 28.8 pg (ref 26.0–34.0)
MCHC: 34.3 g/dL (ref 30.0–36.0)
MCV: 83.9 fL (ref 78.0–100.0)
Monocytes Relative: 6 % (ref 3–12)
Neutrophils Relative %: 68 % (ref 43–77)

## 2013-02-20 LAB — URINALYSIS, ROUTINE W REFLEX MICROSCOPIC
Glucose, UA: NEGATIVE mg/dL
Hgb urine dipstick: NEGATIVE
Ketones, ur: 15 mg/dL — AB
Protein, ur: NEGATIVE mg/dL
Urobilinogen, UA: 0.2 mg/dL (ref 0.0–1.0)

## 2013-02-20 LAB — COMPREHENSIVE METABOLIC PANEL
BUN: 9 mg/dL (ref 6–23)
Calcium: 8.5 mg/dL (ref 8.4–10.5)
GFR calc Af Amer: >90 mL/min (ref >90)
Glucose, Bld: 106 mg/dL — ABNORMAL HIGH (ref 70–99)
Sodium: 135 mEq/L (ref 135–145)
Total Protein: 6.2 g/dL (ref 6.0–8.3)

## 2013-02-20 LAB — LIPASE, BLOOD: Lipase: 62 U/L — ABNORMAL HIGH (ref 11–59)

## 2013-02-20 LAB — POCT PREGNANCY, URINE: Preg Test, Ur: NEGATIVE

## 2013-02-20 MED ORDER — CLONIDINE HCL 0.1 MG PO TABS
0.1000 mg | ORAL_TABLET | Freq: Once | ORAL | Status: AC
Start: 2013-02-20 — End: 2013-02-20
  Administered 2013-02-20: 0.1 mg via ORAL
  Filled 2013-02-20: qty 1

## 2013-02-20 MED ORDER — ONDANSETRON 8 MG PO TBDP
8.0000 mg | ORAL_TABLET | Freq: Once | ORAL | Status: AC
  Administered 2013-02-20: 8 mg via ORAL
  Filled 2013-02-20: qty 1

## 2013-02-20 MED ORDER — SODIUM CHLORIDE 0.9 % IV BOLUS (SEPSIS)
1000.0000 mL | Freq: Once | INTRAVENOUS | Status: AC
  Administered 2013-02-20: 1000 mL via INTRAVENOUS

## 2013-02-20 MED ORDER — DICYCLOMINE HCL 10 MG/ML IM SOLN
20.0000 mg | Freq: Once | INTRAMUSCULAR | Status: AC
  Administered 2013-02-20: 20 mg via INTRAMUSCULAR
  Filled 2013-02-20: qty 2

## 2013-02-20 MED ORDER — METOCLOPRAMIDE HCL 5 MG/ML IJ SOLN
10.0000 mg | Freq: Once | INTRAMUSCULAR | Status: AC
  Administered 2013-02-20: 10 mg via INTRAVENOUS
  Filled 2013-02-20: qty 2

## 2013-02-20 MED ORDER — DIAZEPAM 5 MG/ML IJ SOLN
5.0000 mg | Freq: Once | INTRAMUSCULAR | Status: AC
  Administered 2013-02-20: 5 mg via INTRAVENOUS
  Filled 2013-02-20: qty 2

## 2013-02-20 MED ORDER — PROMETHAZINE HCL 25 MG RE SUPP: 25.0000 mg | Freq: Four times a day (QID) | RECTAL | Status: DC | PRN

## 2013-02-20 NOTE — ED Notes (Signed)
Went to d/c pt. Pt stated she could not see GI and has emesis at home with home meds. PA spoke with pt. Pt to follow up with PCP for different GI MD.

## 2013-02-20 NOTE — ED Notes (Signed)
Patient here for evaluation of abdominal pain and vomiting. States she was seen at [facility] on Wednesday and was referred to GI doc. States that she cannot abdominal pain and nausea. Has been taking Bentyl, Vicodin and Phenergan and Reglan and meds will not stay down. Actively vomiting at triage.

## 2013-02-20 NOTE — ED Provider Notes (Signed)
CSN: [id number]     Arrival date & time 02/20/13  8469 History   First MD Initiated Contact with Patient 02/20/13 [phone]     Chief Complaint  Patient presents with  . Abdominal Pain  . Emesis   HPI  History provided by patient. Patient is a 27 year old female with a history of GERD, "slowed stomach emptying", hypertension and frequent recurrent nausea vomiting who presents with complaints of worsening nausea vomiting symptoms and abdominal pain. Patient has had an ongoing similar problems for years. She has been evaluated in the past by GI specialist with upper endoscopies and stomach emptying studies. She has been told that she has GERD and slowed stomach emptying. Currently she takes Bentyl, Reglan, Phenergan and Percocets for her symptoms. She states she was having some improvement of her symptoms for the past 2 weeks however symptoms returned over the past few days. Denies any changes in medications or diet. She denies any other aggravating or alleviating factors. She denies any associated fever, chills or sweats. Denies any diarrhea symptoms. No urinary complaints. No menstrual changes, vaginal bleeding or vaginal discharge.    Past Medical History  Diagnosis Date  . GERD (gastroesophageal reflux disease)   . Hypertension    History reviewed. No pertinent past surgical history. No family history on file. History  Substance Use Topics  . Smoking status: Former Smoker    Quit date: 06/22/2011  . Smokeless tobacco: Not on file  . Alcohol Use: No   OB History   Grav Para Term Preterm Abortions TAB SAB Ect Mult Living                 Review of Systems  Constitutional: Negative for fever, chills and diaphoresis.  Respiratory: Negative for shortness of breath.   Cardiovascular: Negative for chest pain.  Gastrointestinal: Positive for nausea, vomiting and abdominal pain. Negative for diarrhea and constipation.  Genitourinary: Negative for dysuria, frequency, hematuria, flank pain,  vaginal bleeding and vaginal discharge.  Skin: Negative for rash.  All other systems reviewed and are negative.    Allergies  Cephalexin  Home Medications   Current Outpatient Rx  Name  Route  Sig  Dispense  Refill  . bethanechol (URECHOLINE) 10 MG tablet   Oral   Take 10 mg by mouth 3 (three) times daily.         Marland Kitchen dexlansoprazole (DEXILANT) 60 MG capsule   Oral   Take 60 mg by mouth daily.         . metoCLOPramide (REGLAN) 10 MG tablet   Oral   Take 1 tablet (10 mg total) by mouth every 6 (six) hours.   30 tablet   0   . omeprazole (PRILOSEC) 20 MG capsule   Oral   Take 1 capsule (20 mg total) by mouth daily.   30 capsule   0   . ondansetron (ZOFRAN) 8 MG tablet   Oral   Take 8 mg by mouth every 8 (eight) hours as needed for nausea.         . ondansetron (ZOFRAN-ODT) 8 MG disintegrating tablet   Oral   Take 8 mg by mouth every 8 (eight) hours as needed for nausea.         Marland Kitchen oxyCODONE-acetaminophen (PERCOCET/ROXICET) 5-325 MG per tablet   Oral   Take 1-2 tablets by mouth every 4 (four) hours as needed for pain.   15 tablet   0   . oxyCODONE-acetaminophen (PERCOCET/ROXICET) 5-325 MG per tablet   Oral  Take 1 tablet by mouth every 4 (four) hours as needed for pain.   20 tablet   0   . promethazine (PHENERGAN) 25 MG suppository   Rectal   Place 1 suppository (25 mg total) rectally every 6 (six) hours as needed for nausea.   12 each   0   . promethazine (PHENERGAN) 25 MG tablet   Oral   Take 25 mg by mouth every 6 (six) hours as needed for nausea.         . sucralfate (CARAFATE) 1 GM/10ML suspension   Oral   Take 1 g by mouth 4 (four) times daily.          BP 177/115  Pulse 112  Temp(Src) 98.8 F (37.1 C) (Oral)  Resp 20  Ht 5\' 3"  (1.6 m)  Wt 110 lb (49.896 kg)  BMI 19.49 kg/m2  SpO2 100%  LMP 01/16/2013 Physical Exam  Nursing note and vitals reviewed. Constitutional: She is oriented to person, place, and time. She appears  well-developed and well-nourished. No distress.  HENT:  Head: Normocephalic.  Cardiovascular: Normal rate and regular rhythm.   Pulmonary/Chest: Effort normal and breath sounds normal. No respiratory distress. She has no wheezes. She has no rales.  Abdominal: Soft. There is no tenderness. There is no rebound and no guarding.  No significant abdominal tenderness. No peritoneal signs.  Musculoskeletal: Normal range of motion.  Neurological: She is alert and oriented to person, place, and time.  Skin: Skin is warm and dry. No rash noted.  Psychiatric: She has a normal mood and affect. Her behavior is normal.    ED Course  Procedures   Results for orders placed during the hospital encounter of 02/20/13  CBC WITH DIFFERENTIAL      Result Value Range   WBC 6.2  4.0 - 10.5 K/uL   RBC 4.59  3.87 - 5.11 MIL/uL   Hemoglobin 13.2  12.0 - 15.0 g/dL   HCT 14.7  82.9 - 56.2 %   MCV 83.9  78.0 - 100.0 fL   MCH 28.8  26.0 - 34.0 pg   MCHC 34.3  30.0 - 36.0 g/dL   RDW 13.0  86.5 - 78.4 %   Platelets 307  150 - 400 K/uL   Neutrophils Relative % 68  43 - 77 %   Neutro Abs 4.2  1.7 - 7.7 K/uL   Lymphocytes Relative 26  12 - 46 %   Lymphs Abs 1.6  0.7 - 4.0 K/uL   Monocytes Relative 6  3 - 12 %   Monocytes Absolute 0.4  0.1 - 1.0 K/uL   Eosinophils Relative 0  0 - 5 %   Eosinophils Absolute 0.0  0.0 - 0.7 K/uL   Basophils Relative 0  0 - 1 %   Basophils Absolute 0.0  0.0 - 0.1 K/uL  URINALYSIS, ROUTINE W REFLEX MICROSCOPIC      Result Value Range   Color, Urine YELLOW  YELLOW   APPearance CLEAR  CLEAR   Specific Gravity, Urine 1.034 (*) 1.005 - 1.030   pH 7.0  5.0 - 8.0   Glucose, UA NEGATIVE  NEGATIVE mg/dL   Hgb urine dipstick NEGATIVE  NEGATIVE   Bilirubin Urine SMALL (*) NEGATIVE   Ketones, ur 15 (*) NEGATIVE mg/dL   Protein, ur NEGATIVE  NEGATIVE mg/dL   Urobilinogen, UA 0.2  0.0 - 1.0 mg/dL   Nitrite NEGATIVE  NEGATIVE   Leukocytes, UA NEGATIVE  NEGATIVE  POCT PREGNANCY, URINE  Result Value Range   Preg Test, Ur NEGATIVE  NEGATIVE      Imaging Review No results found.  MDM  No diagnosis found.    Patient seen and evaluated. She appears well no acute distress. She has had similar symptoms for a long period of time. She is holding an emesis bag with some heaving but not producing any vomitus. She did not have any large emesis in triage only small amounts of spitting.  Patient is on narcotics which may be exacerbating her "slowed stomach emptying".  CMP and lipase still pending.  Pt discussed in sign out with Tatyana Kirichenko PA-C.  She will follow labs.   Angus Seller, PA-C 02/20/13 [phone]

## 2013-02-20 NOTE — ED Notes (Signed)
Pt had a little sprite as well as PO med and denied vomiting. Pt c/o still in pain.

## 2013-02-20 NOTE — ED Provider Notes (Signed)
Patient is a 27 year old female who presented to emergency department with nausea vomiting. Patient was signed out to me at shift change from [facility], pending comprehensive metabolic panel and lipase. Patient's results are back and then unremarkable. I reassess patient. Patient states that she's feeling better. She is in no acute distress. She is no longer having abdominal pain nausea or vomiting. Abdomen soft, non tender.   Pt will be discharged home with close follow up.   Filed Vitals:   02/20/13 0325 02/20/13 0614  BP: 177/115 113/68  Pulse: 112 89  Temp: 98.8 F (37.1 C) 98 F (36.7 C)  TempSrc: Oral Oral  Resp: 20 16  Height: 5\' 3"  (1.6 m)   Weight: 110 lb (49.896 kg)   SpO2: 100% 98%   7:40 AM Pt already had her IV out and was being discharged when she told the nurse that she is going to go home, get pain again, and will just come back to the hospital. She requested to speak with me. I have explained to her that I will not be prescribing her any pain medications but can give her anti emetic. She already has reglan and phenergan at home. Will give her phenergan suppositories to try. She also requested different GI doctor referral. She is seeing Dr. Loreta Ave at this time, and that is who is on call currently. Explained she needs to call them. Pt has not had any vomiting in ED, and now stating that I am sending her out and she is not feeling better when just few minutes ago she said she was symptom free. Pt is stable for outpatient follow up at this time.   Filed Vitals:   02/20/13 0325 02/20/13 0614 02/20/13 0724  BP: 177/115 113/68 113/69  Pulse: 112 89 95  Temp: 98.8 F (37.1 C) 98 F (36.7 C)   TempSrc: Oral Oral   Resp: 20 16 16   Height: 5\' 3"  (1.6 m)    Weight: 110 lb (49.896 kg)    SpO2: 100% 98% 100%     Lottie Mussel, PA-C 02/20/13 0707  Lottie Mussel, PA-C 02/20/13 [phone]

## 2013-02-26 NOTE — ED Provider Notes (Signed)
Medical screening examination/treatment/procedure(s) were performed by non-physician practitioner and as supervising physician I was immediately available for consultation/collaboration.   Jader Desai T Makhya Arave, MD 02/26/13 0900 

## 2013-02-26 NOTE — ED Provider Notes (Signed)
Medical screening examination/treatment/procedure(s) were performed by non-physician practitioner and as supervising physician I was immediately available for consultation/collaboration.   Merric Yost T Tomara Youngberg, MD 02/26/13 0859 

## 2013-03-24 ENCOUNTER — Other Ambulatory Visit (INDEPENDENT_AMBULATORY_CARE_PROVIDER_SITE_OTHER): Payer: Managed Care, Other (non HMO)

## 2013-03-24 ENCOUNTER — Ambulatory Visit (INDEPENDENT_AMBULATORY_CARE_PROVIDER_SITE_OTHER): Payer: Managed Care, Other (non HMO) | Admitting: Internal Medicine

## 2013-03-24 ENCOUNTER — Encounter: Payer: Self-pay | Admitting: Internal Medicine

## 2013-03-24 VITALS — BP 116/80 | HR 68 | Ht 63.0 in | Wt 119.0 lb

## 2013-03-24 DIAGNOSIS — R748 Abnormal levels of other serum enzymes: Secondary | ICD-10-CM

## 2013-03-24 DIAGNOSIS — R932 Abnormal findings on diagnostic imaging of liver and biliary tract: Secondary | ICD-10-CM

## 2013-03-24 DIAGNOSIS — K30 Functional dyspepsia: Secondary | ICD-10-CM

## 2013-03-24 DIAGNOSIS — R112 Nausea with vomiting, unspecified: Secondary | ICD-10-CM

## 2013-03-24 DIAGNOSIS — R1013 Epigastric pain: Secondary | ICD-10-CM

## 2013-03-24 DIAGNOSIS — K3189 Other diseases of stomach and duodenum: Secondary | ICD-10-CM

## 2013-03-24 LAB — AMYLASE: Amylase: 131 U/L (ref 27–131)

## 2013-03-24 LAB — LIPASE: Lipase: 26 U/L (ref 11.0–59.0)

## 2013-03-24 LAB — HEPATIC FUNCTION PANEL: Total Bilirubin: 0.6 mg/dL (ref 0.3–1.2)

## 2013-03-24 NOTE — Progress Notes (Addendum)
Referred by: Georgianne Fick, MD  Subjective:    Patient ID: Karen Herrera, female    DOB: 05-Feb-1986, 27 y.o.   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 In June of this year she started having upper abdominal pain, mainly in the epigastrium associated with nausea and vomiting. It was very frequent at that time. She had a friend who knew of Dr. Judy Pimple in [place], and she went there had an upper endoscopy, HIDA scan with ejection fraction and also a gastric emptying study which apparently showed a delayed stomach emptying. She has been treated with a combination of metoclopramide, PPI and bethanechol and is improved. She is not has symptomatic but is still having problems with nausea and vomiting associated with epigastric pain. It seems to be occurring several days out of the month now before she starts menstruation. She does not seem to have any significant lower GI symptoms other than some mild diarrhea at times. She has not had any bleeding. She also has used ondansetron and promethazine with some help. She says she lost 30 pounds without trying. Wt Readings from Last 3 Encounters:  03/24/13 119 lb (53.978 kg)  02/20/13 110 lb (49.896 kg)  04/18/10 148 lb (67.132 kg)   appetite is good. She has some sensation of fullness at times. She is not having heartburn or indigestion.  She has had an elevated lipase. He denies any major changes in medications, or lie for stress etc. prior to the starting. There was no travel there were no sick contacts she has not had fever or chills.  CT scan and ultrasound have suggested abnormality of the left lobe of the liver of unclear etiology. See below.   Allergies  Allergen Reactions  . Cephalexin     REACTION: hives   Outpatient Prescriptions Prior to Visit  Medication Sig Dispense Refill  . bethanechol (URECHOLINE) 10 MG tablet  Take 10 mg by mouth 3 (three) times daily.      . metoCLOPramide (REGLAN) 10 MG tablet Take 1 tablet (10 mg total) by mouth every 6 (six) hours.  30 tablet  0  . omeprazole (PRILOSEC) 20 MG capsule Take 1 capsule (20 mg total) by mouth daily.  30 capsule  0  . ondansetron (ZOFRAN-ODT) 8 MG disintegrating tablet Take 8 mg by mouth every 8 (eight) hours as needed for nausea.      Marland Kitchen dexlansoprazole (DEXILANT) 60 MG capsule Take 60 mg by mouth daily.      . ondansetron (ZOFRAN) 8 MG tablet Take 8 mg by mouth every 8 (eight) hours as needed for nausea.      Marland Kitchen oxyCODONE-acetaminophen (PERCOCET/ROXICET) 5-325 MG per tablet Take 1-2 tablets by mouth every 4 (four) hours as needed for pain.  15 tablet  0  . oxyCODONE-acetaminophen (PERCOCET/ROXICET) 5-325 MG per tablet Take 1 tablet by mouth every 4 (four) hours as needed for pain.  20 tablet  0  . promethazine (PHENERGAN) 25 MG suppository Place 1 suppository (25 mg total) rectally every 6 (six) hours as needed for nausea.  12 each  0  . promethazine (PHENERGAN) 25 MG suppository Place 1 suppository (25 mg total) rectally every 6 (six) hours as needed for nausea.  12 each  0  . promethazine (PHENERGAN) 25 MG tablet Take 25 mg by mouth every 6 (six) hours as needed for nausea.      Marland Kitchen  sucralfate (CARAFATE) 1 GM/10ML suspension Take 1 g by mouth 4 (four) times daily.       No facility-administered medications prior to visit.   Past Medical History  Diagnosis Date  . GERD (gastroesophageal reflux disease)   . Hypertension    Past Surgical History  Procedure Laterality Date  . Neg hx     History   Social History  . Marital Status: Single    Spouse Name: N/A    Number of Children: 0  . Years of Education: N/A   Occupational History  . LPN    Social History Main Topics  . Smoking status: Former Smoker    Types: Cigarettes    Quit date: 06/22/2011  . Smokeless tobacco: Never Used  . Alcohol Use: No  . Drug Use: No  . Sexual Activity: None    Other Topics Concern  . None   Social History Narrative  . None   Family History  Problem Relation Age of Onset  . Hypertension Mother   . Hypertension Father   . Breast cancer Maternal Grandmother     great grand  . Diabetes Maternal Grandmother   . Heart attack Maternal Grandmother     Review of Systems Positive for those things mentioned in the history of present illness as well as some fatigue menstrual pain anxiety and depressive symptomatology allergies and some sleeping difficulty. All other review of systems are negative    Objective:   Physical Exam General:  Well-developed, well-nourished and in no acute distress Eyes:  anicteric. ENT:   Mouth and posterior pharynx free of lesions.  Neck:   supple w/o thyromegaly or mass.  Lungs: Clear to auscultation bilaterally. Heart:  S1S2, no rubs, murmurs, gallops. Abdomen:  soft, non-tender, no hepatosplenomegaly, liver edge palpable 1 FB down, nohernia, or mass and BS+.  Lymph:  no cervical or supraclavicular adenopathy. Extremities:   no edema Skin   no rash. + tattoos and studs Neuro:  A&O x 3.  Psych:  appropriate mood and  Affect.   Data Reviewed: CT abd pelvis with contrast 01/04/2013 1. Hepatomegaly with slight inhomogeneous the liver parenchyma,  nonspecific.  2. Free fluid in the pelvis with a partially collapsed right  ovarian cyst.  Ultrasound abdomen 01/18/2013  Left lobe of the liver has a nodular echogenic pattern. Diffuse  infiltration is not excluded. Correlate clinically and with a lap  tests as for the need for MRI.  As above reviewed with radiology. The overall suspicion is for likely irregular fat deposition in the left lobe of liver and no significant etiology  Lab Results  Component Value Date   LIPASE 62* 02/20/2013  01/18/2013 117 (H)    LFT's NL Assessment & Plan:   1. Abdominal pain, epigastric   2. Nausea and vomiting   3. Abnormal serum lipase level   4. Abnormal liver CT and  ultrasound    Cause of these problems is not clear. It seems unlikely that the liver abnormalities are related. The elevation in lipase suggest possible pancreatic problem. I need to review the records from [place].  1. She will hold bethanechol 2. Consider MRI of the liver, Eovist should be used if done. Seems unlikely that she has significant pathology in the liver however. 3. If the lipase is persistently elevated then I do think an MRI MRCP would be reasonable. Could need endoscopic ultrasound. 4. I will call her after I reviewed the labs from today and her outside records.  Current outpatient prescriptions:bethanechol (URECHOLINE) 10 MG tablet, Take 10 mg by mouth 3 (three) times daily., Disp: , Rfl: ;  metoCLOPramide (REGLAN) 10 MG tablet, Take 1 tablet (10 mg total) by mouth every 6 (six) hours., Disp: 30 tablet, Rfl: 0;  omeprazole (PRILOSEC) 20 MG capsule, Take 1 capsule (20 mg total) by mouth daily., Disp: 30 capsule, Rfl: 0 ondansetron (ZOFRAN-ODT) 8 MG disintegrating tablet, Take 8 mg by mouth every 8 (eight) hours as needed for nausea., Disp: , Rfl:   I appreciate the opportunity to care for this patient. CC: RAMACHANDRAN,AJITH, MD   Review of workup by Dr. Judy Pimple showed that she had a normal hepatobiliary study on 02/27/2013 with a gallbladder ejection fraction 63%. She had grade B. reflux esophagitis and erythema in the stomach, she had esophageal gastric and duodenal biopsies taken. The biopsies were all unremarkable i.e. normal. She had a gastric emptying study with 7% emptying at 1 hour and 42% at 2 hours. Normal scar 27-67% and 55-100% respectively. Thus she has a clinical diagnosis of gastroparesis.

## 2013-03-24 NOTE — Patient Instructions (Signed)
Your physician has requested that you go to the basement for the following lab work before leaving today: Amylase, Lipase, LFT's  We will obtain your GI records from Dr. Judy Pimple in [place] for Dr. Leone Payor to review.  We will be in touch after Dr. Leone Payor reviews your CT scans with radiology.  I appreciate the opportunity to care for you.

## 2013-03-26 ENCOUNTER — Telehealth: Payer: Self-pay | Admitting: Internal Medicine

## 2013-03-26 ENCOUNTER — Encounter (HOSPITAL_COMMUNITY): Payer: Self-pay | Admitting: Emergency Medicine

## 2013-03-26 ENCOUNTER — Emergency Department (HOSPITAL_COMMUNITY)
Admission: EM | Admit: 2013-03-26 | Discharge: 2013-03-26 | Disposition: A | Discharge status: Home / Self Care | Payer: Managed Care, Other (non HMO) | Attending: Emergency Medicine | Admitting: Emergency Medicine

## 2013-03-26 DIAGNOSIS — I10 Essential (primary) hypertension: Secondary | ICD-10-CM | POA: Insufficient documentation

## 2013-03-26 DIAGNOSIS — R933 Abnormal findings on diagnostic imaging of other parts of digestive tract: Secondary | ICD-10-CM

## 2013-03-26 DIAGNOSIS — R1013 Epigastric pain: Secondary | ICD-10-CM | POA: Insufficient documentation

## 2013-03-26 DIAGNOSIS — K219 Gastro-esophageal reflux disease without esophagitis: Secondary | ICD-10-CM | POA: Insufficient documentation

## 2013-03-26 DIAGNOSIS — Z3202 Encounter for pregnancy test, result negative: Secondary | ICD-10-CM | POA: Insufficient documentation

## 2013-03-26 DIAGNOSIS — E876 Hypokalemia: Secondary | ICD-10-CM

## 2013-03-26 DIAGNOSIS — R109 Unspecified abdominal pain: Secondary | ICD-10-CM

## 2013-03-26 DIAGNOSIS — Z87891 Personal history of nicotine dependence: Secondary | ICD-10-CM | POA: Insufficient documentation

## 2013-03-26 DIAGNOSIS — Z79899 Other long term (current) drug therapy: Secondary | ICD-10-CM | POA: Insufficient documentation

## 2013-03-26 DIAGNOSIS — R1110 Vomiting, unspecified: Secondary | ICD-10-CM | POA: Insufficient documentation

## 2013-03-26 DIAGNOSIS — Z8719 Personal history of other diseases of the digestive system: Secondary | ICD-10-CM | POA: Insufficient documentation

## 2013-03-26 HISTORY — DX: Gastroparesis: K31.84

## 2013-03-26 LAB — CBC WITH DIFFERENTIAL/PLATELET
Eosinophils Relative: 0 % (ref 0–5)
HCT: 38.5 % (ref 36.0–46.0)
Lymphocytes Relative: 13 % (ref 12–46)
Lymphs Abs: 0.9 10*3/uL (ref 0.7–4.0)
MCV: 80.7 fL (ref 78.0–100.0)
Monocytes Absolute: 0.2 10*3/uL (ref 0.1–1.0)
RBC: 4.77 MIL/uL (ref 3.87–5.11)
WBC: 6.7 10*3/uL (ref 4.0–10.5)

## 2013-03-26 LAB — URINALYSIS, ROUTINE W REFLEX MICROSCOPIC
Bilirubin Urine: NEGATIVE
Glucose, UA: 500 mg/dL — AB
Hgb urine dipstick: NEGATIVE
Ketones, ur: >80 mg/dL — AB
Leukocytes, UA: NEGATIVE
Protein, ur: NEGATIVE mg/dL

## 2013-03-26 LAB — COMPREHENSIVE METABOLIC PANEL
ALT: 15 U/L (ref 0–35)
CO2: 22 mEq/L (ref 19–32)
Calcium: 9.9 mg/dL (ref 8.4–10.5)
Chloride: 96 mEq/L (ref 96–112)
GFR calc Af Amer: >90 mL/min (ref >90)
GFR calc non Af Amer: >90 mL/min (ref >90)
Glucose, Bld: 137 mg/dL — ABNORMAL HIGH (ref 70–99)
Sodium: 137 mEq/L (ref 135–145)
Total Bilirubin: 0.5 mg/dL (ref 0.3–1.2)

## 2013-03-26 MED ORDER — SODIUM CHLORIDE 0.9 % IV BOLUS (SEPSIS)
1000.0000 mL | Freq: Once | INTRAVENOUS | Status: AC
  Administered 2013-03-26: 1000 mL via INTRAVENOUS

## 2013-03-26 MED ORDER — GI COCKTAIL ~~LOC~~
30.0000 mL | Freq: Once | ORAL | Status: AC
  Administered 2013-03-26: 30 mL via ORAL
  Filled 2013-03-26: qty 30

## 2013-03-26 MED ORDER — OXYCODONE-ACETAMINOPHEN 5-325 MG PO TABS: 2.0000 | ORAL_TABLET | ORAL | Status: DC | PRN

## 2013-03-26 MED ORDER — ONDANSETRON 4 MG PO TBDP: 4.0000 mg | ORAL_TABLET | Freq: Three times a day (TID) | ORAL | Status: DC | PRN

## 2013-03-26 MED ORDER — HYDROMORPHONE HCL PF 1 MG/ML IJ SOLN
1.0000 mg | Freq: Once | INTRAMUSCULAR | Status: AC
  Administered 2013-03-26: 1 mg via INTRAVENOUS
  Filled 2013-03-26: qty 1

## 2013-03-26 MED ORDER — ONDANSETRON HCL 4 MG/2ML IJ SOLN
4.0000 mg | Freq: Once | INTRAMUSCULAR | Status: AC
  Administered 2013-03-26: 4 mg via INTRAVENOUS
  Filled 2013-03-26: qty 2

## 2013-03-26 NOTE — ED Notes (Signed)
Pt with hx of abdominal pain and emesis.  Being seen by a gastroenterologist with no definite dx.  This episode started on Sat.  With previous episodes, the pain has been relieved after being tx in ED with GI cocktail.

## 2013-03-26 NOTE — Telephone Encounter (Signed)
Correct advice given - IV or IM anti-emetics likely needed and ED or urgent care place for that Have not yet seen [facility] records  Sounds like she could have gastroparesis but not clear  We can Rx prochloperazine 25 mg suppositories - 1 every 12 hrs prn # 12 no refill  She needs an MR of liver with and without Eovist contrast to evaluate abnormal liver on CT and [place] Also do an MRCP

## 2013-03-26 NOTE — Telephone Encounter (Signed)
I spoke with the the patient.  She is admitted in the ER now.  She is aware that I will contact her on Monday about setting up MRI/MRCP.  She is also advised that I have called in phenergan suppositories.  I can not enter the order into EPIC because the ER has the chart locked.  She is aware I will contact her on Monday about setting up xrays.

## 2013-03-26 NOTE — Telephone Encounter (Signed)
Patient's friend called and stated that the patient has intractable pain and vomiting.  I could hear the patient vomiting in the back ground.  Patient has taken zofran and phenergan suppository and this has not helped.  She is not able to stop vomiting.  She is advised that if she is not able to stop vomiting despite the phenergan and zofran.   Her friend that is with her is upset and feels that no one is trying to help her.  I can hear the patient audibly vomiting in the back ground.  I have suggested that she take another zofran ODT now.  If she is not able to stop vomiting and heaving she will need to go the ER.  The friend would like to bring her here to our office.  I have explained that we are not an acute care facility and do not have the ability to provide the care that she needs.  They do not want to go to the ER because they feel they are treated "Like criminals seeking pain meds".  I again explained that I will have Dr. Leone Payor review for the next step, but she needs to stop the pain and vomiting and that she will need the ER if the zofran and phenergan are not helping.  The friend abruptly ended the phone call stating that "I have to take this other call".  I have notified Dr. Russella Dar, he is on call this pm,  that the patient may be going to the ER.  He agreed with ER plan.  Dr. Leone Payor do you want to order any additional testing, or still wait for the records from her other GI?

## 2013-03-26 NOTE — ED Provider Notes (Signed)
CSN: [id number]     Arrival date & time 03/26/13  1643 History   First MD Initiated Contact with Patient 03/26/13 1707     Chief Complaint  Patient presents with  . Abdominal Pain  . Emesis   (Consider location/radiation/quality/duration/timing/severity/associated sxs/prior Treatment) HPI Comments: Pt is a 27 y/o female with hx of recent history of abdominal pain which is epigastric in location, intermittent and colicky, seems to come on once a month. She correlates to pain somewhat with her menstrual cycle. She has been evaluated by Dr. Leone Payor with gastroenterology, prior to her visit today she has had evaluation for gastroparesis, ultrasound, HIDA scan, upper endoscopy none of which have shown an etiology for her symptoms. She states that her symptoms started several days ago, they have been relatively persistent but gradually worsening and today it became severe. She has had multiple episodes of vomiting, denies diarrhea, dysuria, rashes, fever, cough or shortness of breath. She denies pregnancy.  Patient is a 27 y.o. female presenting with abdominal pain and vomiting. The history is provided by the patient.  Abdominal Pain Associated symptoms: vomiting   Emesis Associated symptoms: abdominal pain     Past Medical History  Diagnosis Date  . GERD (gastroesophageal reflux disease)   . Hypertension   . Gastroparesis    Past Surgical History  Procedure Laterality Date  . Neg hx     Family History  Problem Relation Age of Onset  . Hypertension Mother   . Hypertension Father   . Breast cancer Maternal Grandmother     great grand  . Diabetes Maternal Grandmother   . Heart attack Maternal Grandmother    History  Substance Use Topics  . Smoking status: Former Smoker    Types: Cigarettes    Quit date: 06/22/2011  . Smokeless tobacco: Never Used  . Alcohol Use: No   OB History   Grav Para Term Preterm Abortions TAB SAB Ect Mult Living                 Review of Systems   Gastrointestinal: Positive for vomiting and abdominal pain.  All other systems reviewed and are negative.    Allergies  Cephalexin  Home Medications   Current Outpatient Rx  Name  Route  Sig  Dispense  Refill  . bethanechol (URECHOLINE) 10 MG tablet   Oral   Take 10 mg by mouth 3 (three) times daily.         . metoCLOPramide (REGLAN) 10 MG tablet   Oral   Take 1 tablet (10 mg total) by mouth every 6 (six) hours.   30 tablet   0   . omeprazole (PRILOSEC) 20 MG capsule   Oral   Take 1 capsule (20 mg total) by mouth daily.   30 capsule   0   . ondansetron (ZOFRAN-ODT) 8 MG disintegrating tablet   Oral   Take 8 mg by mouth every 8 (eight) hours as needed for nausea.         . promethazine (PHENERGAN) 25 MG suppository   Rectal   Place 25 mg rectally every 6 (six) hours as needed for nausea.         . ondansetron (ZOFRAN ODT) 4 MG disintegrating tablet   Oral   Take 1 tablet (4 mg total) by mouth every 8 (eight) hours as needed for nausea.   10 tablet   0   . oxyCODONE-acetaminophen (PERCOCET/ROXICET) 5-325 MG per tablet   Oral   Take 2 tablets  by mouth every 4 (four) hours as needed for pain.   10 tablet   0    BP 127/89  Pulse 84  Temp(Src) 98.4 F (36.9 C) (Oral)  Resp 18  Ht 5\' 3"  (1.6 m)  Wt 120 lb (54.432 kg)  BMI 21.26 kg/m2  SpO2 100%  LMP 03/20/2013 Physical Exam  Nursing note and vitals reviewed. Constitutional: She appears well-developed and well-nourished.  Uncomfortable appearing  HENT:  Head: Normocephalic and atraumatic.  Mouth/Throat: Oropharynx is clear and moist. No oropharyngeal exudate.  Eyes: Conjunctivae and EOM are normal. Pupils are equal, round, and reactive to light. Right eye exhibits no discharge. Left eye exhibits no discharge. No scleral icterus.  Neck: Normal range of motion. Neck supple. No JVD present. No thyromegaly present.  Cardiovascular: Normal rate, regular rhythm, normal heart sounds and intact distal  pulses.  Exam reveals no gallop and no friction rub.   No murmur heard. Pulmonary/Chest: Effort normal and breath sounds normal. No respiratory distress. She has no wheezes. She has no rales.  Abdominal: Soft. Bowel sounds are normal. She exhibits no distension and no mass. There is tenderness (tenderness isolated to the epigastrium, no masses, no guarding, no peritoneal signs, no pain at McBurney's point, no Murphy sign).  Musculoskeletal: Normal range of motion. She exhibits no edema and no tenderness.  Lymphadenopathy:    She has no cervical adenopathy.  Neurological: She is alert. Coordination normal.  Skin: Skin is warm and dry. No rash noted. No erythema.  Psychiatric: She has a normal mood and affect. Her behavior is normal.    ED Course  Procedures (including critical care time) Labs Review Labs Reviewed  CBC WITH DIFFERENTIAL - Abnormal; Notable for the following:    MCHC 36.1 (*)    Neutrophils Relative % 84 (*)    All other components within normal limits  COMPREHENSIVE METABOLIC PANEL - Abnormal; Notable for the following:    Potassium 3.2 (*)    Glucose, Bld 137 (*)    All other components within normal limits  URINALYSIS, ROUTINE W REFLEX MICROSCOPIC - Abnormal; Notable for the following:    pH 8.5 (*)    Glucose, UA 500 (*)    Ketones, ur >80 (*)    All other components within normal limits  LIPASE, BLOOD  PREGNANCY, URINE   Imaging Review No results found.  EKG Interpretation   None       MDM   1. Abdominal pain   2. Hypokalemia    The patient has ongoing discomfort, her vital signs are overall very stable except for some hypertension, we'll check electrolytes, liver and lipase, symptomatic medications, doubt etiology will be found today. She does have good followup with gastroenterology.  Labs unremarkable, potassium ordered for patient his prescription due to mild hypokalemia, symptoms near completely improved with medications, the patient appears  stable for ongoing followup with gastroenterology.   Meds given in ED:  Medications  gi cocktail (Maalox,Lidocaine,Donnatal) (30 mLs Oral Given 03/26/13 1727)  sodium chloride 0.9 % bolus 1,000 mL (0 mLs Intravenous Stopped 03/26/13 1928)  HYDROmorphone (DILAUDID) injection 1 mg (1 mg Intravenous Given 03/26/13 1811)  ondansetron (ZOFRAN) injection 4 mg (4 mg Intravenous Given 03/26/13 1811)  sodium chloride 0.9 % bolus 1,000 mL (0 mLs Intravenous Stopped 03/26/13 1954)    Discharge Medication List as of 03/26/2013  7:38 PM    START taking these medications   Details  !! ondansetron (ZOFRAN ODT) 4 MG disintegrating tablet Take 1 tablet (  4 mg total) by mouth every 8 (eight) hours as needed for nausea., Starting 03/26/2013, Until Discontinued, Print    oxyCODONE-acetaminophen (PERCOCET/ROXICET) 5-325 MG per tablet Take 2 tablets by mouth every 4 (four) hours as needed for pain., Starting 03/26/2013, Until Discontinued, Print     !! - Potential duplicate medications found. Please discuss with provider.          Vida Roller, MD 03/26/13 [phone]

## 2013-03-28 ENCOUNTER — Encounter (HOSPITAL_COMMUNITY): Payer: Self-pay | Admitting: Emergency Medicine

## 2013-03-28 ENCOUNTER — Emergency Department (HOSPITAL_COMMUNITY)
Admission: EM | Admit: 2013-03-28 | Discharge: 2013-03-28 | Disposition: A | Discharge status: Home / Self Care | Payer: Managed Care, Other (non HMO) | Attending: Emergency Medicine | Admitting: Emergency Medicine

## 2013-03-28 DIAGNOSIS — Z79899 Other long term (current) drug therapy: Secondary | ICD-10-CM | POA: Insufficient documentation

## 2013-03-28 DIAGNOSIS — I10 Essential (primary) hypertension: Secondary | ICD-10-CM | POA: Insufficient documentation

## 2013-03-28 DIAGNOSIS — Z87891 Personal history of nicotine dependence: Secondary | ICD-10-CM | POA: Insufficient documentation

## 2013-03-28 DIAGNOSIS — K219 Gastro-esophageal reflux disease without esophagitis: Secondary | ICD-10-CM | POA: Insufficient documentation

## 2013-03-28 DIAGNOSIS — K3184 Gastroparesis: Secondary | ICD-10-CM | POA: Insufficient documentation

## 2013-03-28 DIAGNOSIS — R1110 Vomiting, unspecified: Secondary | ICD-10-CM | POA: Insufficient documentation

## 2013-03-28 LAB — COMPREHENSIVE METABOLIC PANEL
ALT: 13 U/L (ref 0–35)
AST: 20 U/L (ref 0–37)
Albumin: 5.2 g/dL (ref 3.5–5.2)
Alkaline Phosphatase: 79 U/L (ref 39–117)
BUN: 10 mg/dL (ref 6–23)
CO2: 22 mEq/L (ref 19–32)
Calcium: 9.8 mg/dL (ref 8.4–10.5)
Chloride: 94 mEq/L — ABNORMAL LOW (ref 96–112)
Creatinine, Ser: 0.74 mg/dL (ref 0.50–1.10)
GFR calc Af Amer: >90 mL/min (ref >90)
GFR calc non Af Amer: >90 mL/min (ref >90)
Glucose, Bld: 94 mg/dL (ref 70–99)
Potassium: 3.3 mEq/L — ABNORMAL LOW (ref 3.5–5.1)
Sodium: 134 mEq/L — ABNORMAL LOW (ref 135–145)
Total Bilirubin: 0.4 mg/dL (ref 0.3–1.2)
Total Protein: 8.7 g/dL — ABNORMAL HIGH (ref 6.0–8.3)

## 2013-03-28 LAB — URINALYSIS, ROUTINE W REFLEX MICROSCOPIC
Bilirubin Urine: NEGATIVE
Glucose, UA: 100 mg/dL — AB
Hgb urine dipstick: NEGATIVE
Ketones, ur: >80 mg/dL — AB
Leukocytes, UA: NEGATIVE
Nitrite: NEGATIVE
Protein, ur: NEGATIVE mg/dL
Specific Gravity, Urine: 1.014 (ref 1.005–1.030)
Urobilinogen, UA: 0.2 mg/dL (ref 0.0–1.0)
pH: 7 (ref 5.0–8.0)

## 2013-03-28 LAB — CBC WITH DIFFERENTIAL/PLATELET
Basophils Absolute: 0 10*3/uL (ref 0.0–0.1)
Basophils Relative: 0 % (ref 0–1)
Eosinophils Absolute: 0 10*3/uL (ref 0.0–0.7)
Eosinophils Relative: 0 % (ref 0–5)
HCT: 41.2 % (ref 36.0–46.0)
Hemoglobin: 14.7 g/dL (ref 12.0–15.0)
Lymphocytes Relative: 12 % (ref 12–46)
Lymphs Abs: 1.4 10*3/uL (ref 0.7–4.0)
MCH: 29.2 pg (ref 26.0–34.0)
MCHC: 35.7 g/dL (ref 30.0–36.0)
MCV: 81.9 fL (ref 78.0–100.0)
Monocytes Absolute: 0.3 10*3/uL (ref 0.1–1.0)
Monocytes Relative: 2 % — ABNORMAL LOW (ref 3–12)
Neutro Abs: 9.8 10*3/uL — ABNORMAL HIGH (ref 1.7–7.7)
Neutrophils Relative %: 86 % — ABNORMAL HIGH (ref 43–77)
Platelets: 330 10*3/uL (ref 150–400)
RBC: 5.03 MIL/uL (ref 3.87–5.11)
RDW: 13 % (ref 11.5–15.5)
WBC: 11.4 10*3/uL — ABNORMAL HIGH (ref 4.0–10.5)

## 2013-03-28 LAB — LIPASE, BLOOD: Lipase: 45 U/L (ref 11–59)

## 2013-03-28 LAB — PREGNANCY, URINE: Preg Test, Ur: NEGATIVE

## 2013-03-28 MED ORDER — HYDROMORPHONE HCL PF 1 MG/ML IJ SOLN
1.0000 mg | Freq: Once | INTRAMUSCULAR | Status: AC
  Administered 2013-03-28: 1 mg via INTRAVENOUS
  Filled 2013-03-28: qty 1

## 2013-03-28 MED ORDER — SODIUM CHLORIDE 0.9 % IV BOLUS (SEPSIS)
2000.0000 mL | Freq: Once | INTRAVENOUS | Status: AC
  Administered 2013-03-28: 2000 mL via INTRAVENOUS

## 2013-03-28 MED ORDER — FENTANYL CITRATE 0.05 MG/ML IJ SOLN
100.0000 ug | Freq: Once | INTRAMUSCULAR | Status: AC
Start: 2013-03-28 — End: 2013-03-28
  Administered 2013-03-28: 100 ug via INTRAVENOUS
  Filled 2013-03-28: qty 2

## 2013-03-28 MED ORDER — PROMETHAZINE HCL 25 MG/ML IJ SOLN
12.5000 mg | Freq: Once | INTRAMUSCULAR | Status: AC
  Administered 2013-03-28: 12.5 mg via INTRAVENOUS
  Filled 2013-03-28: qty 1

## 2013-03-28 MED ORDER — LORAZEPAM 1 MG PO TABS: 1.0000 mg | ORAL_TABLET | Freq: Three times a day (TID) | ORAL | Status: DC | PRN

## 2013-03-28 MED ORDER — ONDANSETRON HCL 4 MG/2ML IJ SOLN: 4.0000 mg | Freq: Once | INTRAMUSCULAR | Status: DC

## 2013-03-28 NOTE — ED Provider Notes (Signed)
CSN: [id number]     Arrival date & time 03/28/13  7829 History   First MD Initiated Contact with Patient 03/28/13 [phone]     Chief Complaint  Patient presents with  . Nausea   (Consider location/radiation/quality/duration/timing/severity/associated sxs/prior Treatment) HPI Patient presents to the emergency department with continued epigastric discomfort, nausea and vomiting.  Patient, states, that she was feeling some better after her last emergency department visit, but did not completely resolve her symptoms.  Patient, states, that she's seen GI here in [place].  The patient, states, that the, medication she's been prescribed are not staying down.  The patient denies chest pain, shortness of breath, headache, blurred vision, weakness, numbness, dizziness, back pain, dysuria, vaginal bleeding, vaginal discharge, or syncope.  Patient also denies fever. Past Medical History  Diagnosis Date  . GERD (gastroesophageal reflux disease)   . Hypertension   . Gastroparesis    Past Surgical History  Procedure Laterality Date  . Neg hx     Family History  Problem Relation Age of Onset  . Hypertension Mother   . Hypertension Father   . Breast cancer Maternal Grandmother     great grand  . Diabetes Maternal Grandmother   . Heart attack Maternal Grandmother    History  Substance Use Topics  . Smoking status: Former Smoker    Types: Cigarettes    Quit date: 06/22/2011  . Smokeless tobacco: Never Used  . Alcohol Use: No   OB History   Grav Para Term Preterm Abortions TAB SAB Ect Mult Living                 Review of Systems All other systems negative except as documented in the HPI. All pertinent positives and negatives as reviewed in the HPI. Allergies  Cephalexin  Home Medications   Current Outpatient Rx  Name  Route  Sig  Dispense  Refill  . bethanechol (URECHOLINE) 10 MG tablet   Oral   Take 10 mg by mouth 3 (three) times daily.         . metoCLOPramide (REGLAN) 10 MG  tablet   Oral   Take 1 tablet (10 mg total) by mouth every 6 (six) hours.   30 tablet   0   . omeprazole (PRILOSEC) 20 MG capsule   Oral   Take 1 capsule (20 mg total) by mouth daily.   30 capsule   0   . ondansetron (ZOFRAN ODT) 4 MG disintegrating tablet   Oral   Take 1 tablet (4 mg total) by mouth every 8 (eight) hours as needed for nausea.   10 tablet   0   . ondansetron (ZOFRAN-ODT) 8 MG disintegrating tablet   Oral   Take 8 mg by mouth every 8 (eight) hours as needed for nausea.         Marland Kitchen oxyCODONE-acetaminophen (PERCOCET/ROXICET) 5-325 MG per tablet   Oral   Take 2 tablets by mouth every 4 (four) hours as needed for pain.   10 tablet   0   . promethazine (PHENERGAN) 25 MG suppository   Rectal   Place 25 mg rectally every 6 (six) hours as needed for nausea.          BP 174/104  Pulse 77  Temp(Src) 98.3 F (36.8 C) (Oral)  Resp 22  SpO2 99%  LMP 03/20/2013 Physical Exam  Nursing note and vitals reviewed. Constitutional: She is oriented to person, place, and time. She appears well-developed and well-nourished.  HENT:  Head: Normocephalic  and atraumatic.  Eyes: Pupils are equal, round, and reactive to light.  Neck: Normal range of motion. Neck supple.  Cardiovascular: Normal rate, regular rhythm and normal heart sounds.  Exam reveals no gallop and no friction rub.   No murmur heard. Pulmonary/Chest: Effort normal and breath sounds normal. No respiratory distress.  Abdominal: Soft. Normal appearance. She exhibits no distension, no ascites, no pulsatile midline mass and no mass. Bowel sounds are decreased. There is tenderness in the right upper quadrant and epigastric area. There is no rigidity, no rebound, no guarding and no CVA tenderness. No hernia.    Neurological: She is alert and oriented to person, place, and time. She exhibits normal muscle tone. Coordination normal.  Skin: Skin is warm and dry. No rash noted. No erythema.    ED Course    Procedures (including critical care time) 6:30 a.m. the patient was seen and evaluated patient and family were given the plan and all questions were answered.  Patient was given a detailed explanation of the course from the emergency department on and I explained that this will need to be closely followed by GI and further care will be necessary.  Also advised the patient that she has a complicated course with this continued vomiting, and gastroparesis.  Patient was given Ativan to control her symptoms, as she's been refractory to other treatments.  Patient feels more this plan and is told to return here as needed  Carlyle Dolly, PA-C 03/29/13 1517

## 2013-03-28 NOTE — ED Notes (Signed)
Pt presents with c/o nausea vomiting since June off and on. States 30 lb weight loss since march. States she cannot keep down the prescriptions she was sent home with Thursday from [facility] ED.

## 2013-03-28 NOTE — ED Notes (Signed)
Pt c/o nausea and vomiting off and on since June.

## 2013-03-29 NOTE — ED Provider Notes (Signed)
Medical screening examination/treatment/procedure(s) were performed by non-physician practitioner and as supervising physician I was immediately available for consultation/collaboration.  Tkeya Stencil R. Darrelle Barrell, MD 03/29/13 1625 

## 2013-03-30 NOTE — Telephone Encounter (Signed)
MRI/MRCP is scheduled for 04/03/13 7:00 arrival for 7:30 am appt at [facility] [address].  NPO after midnight.

## 2013-03-30 NOTE — Telephone Encounter (Signed)
Patient is advised of the appt for MRI/MRCP on 04/03/13.  She verbalized understanding of arrival time and to be NPO

## 2013-04-03 ENCOUNTER — Ambulatory Visit
Admission: RE | Admit: 2013-04-03 | Discharge: 2013-04-03 | Disposition: A | Discharge status: Home / Self Care | Payer: Managed Care, Other (non HMO) | Source: Ambulatory Visit | Attending: Internal Medicine | Admitting: Internal Medicine

## 2013-04-03 ENCOUNTER — Encounter: Payer: Self-pay | Admitting: Internal Medicine

## 2013-04-03 DIAGNOSIS — K30 Functional dyspepsia: Secondary | ICD-10-CM

## 2013-04-03 DIAGNOSIS — R933 Abnormal findings on diagnostic imaging of other parts of digestive tract: Secondary | ICD-10-CM

## 2013-04-03 DIAGNOSIS — K3184 Gastroparesis: Secondary | ICD-10-CM | POA: Insufficient documentation

## 2013-04-03 HISTORY — DX: Functional dyspepsia: K30

## 2013-04-03 MED ORDER — GADOXETATE DISODIUM 0.25 MMOL/ML IV SOLN
5.0000 mL | Freq: Once | INTRAVENOUS | Status: AC | PRN
  Administered 2013-04-03: 5 mL via INTRAVENOUS

## 2013-04-03 NOTE — Progress Notes (Signed)
Quick Note:  Good news - Liver is ok on MRII Please ask: 1) how is she? 2) exactly what meds is she taking regularly or prn now 3) set up rev me when we can 4) her stomach does not empty properly and that is problem - gastroparesis - I reviewed records from [place] 5) needs a gastroparesis diet 6) might be best for her to get REV next week with NP/PA when I am in office ______

## 2013-04-09 ENCOUNTER — Encounter: Payer: Self-pay | Admitting: Physician Assistant

## 2013-04-09 ENCOUNTER — Ambulatory Visit (INDEPENDENT_AMBULATORY_CARE_PROVIDER_SITE_OTHER): Payer: Managed Care, Other (non HMO) | Admitting: Physician Assistant

## 2013-04-09 VITALS — BP 110/60 | HR 72 | Ht 63.0 in | Wt 114.0 lb

## 2013-04-09 DIAGNOSIS — K3184 Gastroparesis: Secondary | ICD-10-CM

## 2013-04-09 MED ORDER — ONDANSETRON 4 MG PO TBDP: 4.0000 mg | ORAL_TABLET | Freq: Two times a day (BID) | ORAL | Status: DC

## 2013-04-09 MED ORDER — METOCLOPRAMIDE HCL 10 MG PO TABS: ORAL_TABLET | ORAL | Status: DC

## 2013-04-09 MED ORDER — OMEPRAZOLE 20 MG PO CPDR: 20.0000 mg | DELAYED_RELEASE_CAPSULE | Freq: Every day | ORAL | Status: DC

## 2013-04-09 NOTE — Progress Notes (Signed)
Subjective:    Patient ID: Karen Herrera, female    DOB: Apr 03, 1986, 27 y.o.   MRN: [id number]  HPI  Karen Herrera  is a pleasant 27 year old female known to Dr. Leone Payor who has recently undergone workup for complaints of upper abdominal pain nausea vomiting and weight loss. She had initial workup done in [place] and results of that workup show evidence for gastroparesis with a gastric emptying scan showing 42% emptying at 2 hours with a normal range of 55 -100. GERD on ultrasound had been done and was negative and HIDA scan showed an EF of 63%. She had undergone upper endoscopy in September of 2014 as well which did show a grade B. Esophagitis. She was seen here and started on a trial of Reglan and has been using Zofran as needed. She did have an ER visit in mid October for an episode of intractable nausea and vomiting but says over the past couple of weeks she has been doing better. She however continues to have daily nausea which has been persistent. She says she wakes up every morning nauseated. She's also continues to have upper abdominal discomfort fullness and bloating and says she is unable to lie on her stomach or lying flat at night due to discomfort. She says she is eating but " eats like a bird", very small amounts frequently. Her weight is down about 4 pounds since last office visit. She had also undergone CT scan of the abdomen which was abnormal with hepatomegaly and slight inhomogeneous parenchyma. Subsequently an MRI and MRCP had been done 03/30/2013 and this has been read as normal.    Review of Systems  Constitutional: Positive for appetite change and unexpected weight change.  HENT: Negative.   Eyes: Negative.   Respiratory: Negative.   Cardiovascular: Negative.   Gastrointestinal: Positive for nausea and abdominal pain.  Endocrine: Negative.   Genitourinary: Negative.   Musculoskeletal: Negative.   Skin: Negative.   Allergic/Immunologic: Negative.   Neurological: Negative.    Hematological: Negative.   Psychiatric/Behavioral: Negative.    Outpatient Prescriptions Prior to Visit  Medication Sig Dispense Refill  . bethanechol (URECHOLINE) 10 MG tablet Take 10 mg by mouth 3 (three) times daily.      Marland Kitchen LORazepam (ATIVAN) 1 MG tablet Take 1 tablet (1 mg total) by mouth 3 (three) times daily as needed for anxiety.  15 tablet  0  . promethazine (PHENERGAN) 25 MG suppository Place 25 mg rectally every 6 (six) hours as needed for nausea.      . metoCLOPramide (REGLAN) 10 MG tablet Take 1 tablet (10 mg total) by mouth every 6 (six) hours.  30 tablet  0  . omeprazole (PRILOSEC) 20 MG capsule Take 1 capsule (20 mg total) by mouth daily.  30 capsule  0  . ondansetron (ZOFRAN ODT) 4 MG disintegrating tablet Take 1 tablet (4 mg total) by mouth every 8 (eight) hours as needed for nausea.  10 tablet  0  . oxyCODONE-acetaminophen (PERCOCET/ROXICET) 5-325 MG per tablet Take 2 tablets by mouth every 4 (four) hours as needed for pain.  10 tablet  0   No facility-administered medications prior to visit.   Allergies  Allergen Reactions  . Cephalexin Hives    REACTION: hives   Patient Active Problem List   Diagnosis Date Noted  . Delayed gastric emptying 04/03/2013  . Abnormal liver CT and ultrasound 03/24/2013  . Abdominal pain, epigastric 03/24/2013  . Nausea and vomiting 03/24/2013  . GERD 04/18/2010   History  Substance Use Topics  . Smoking status: Former Smoker    Types: Cigarettes    Quit date: 06/22/2011  . Smokeless tobacco: Never Used  . Alcohol Use: No   family history includes Breast cancer in her maternal grandmother; Diabetes in her maternal grandmother; Heart attack in her maternal grandmother; Hypertension in her father and mother.     Objective:   Physical Exam  well-developed African American female in no acute distress, pleasant. Thin. Blood pressure 110/60, pulse 72 height 5 foot 3 weight 114. 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 She continues to be symptomatic but has not been vomiting. #2 GERD symptoms improved  Plan; long discussion with patient regarding management of gastroparesis and also discussed some tools to help prevent recurrent emergency room visits We will increase Reglan to 4 times daily but try 5 mg one half hour a.c. and at bedtime. She will use of Zofran 4 mg every morning for now to try to abort for nausea and may use a second dose later in the day as needed. She says she tolerates the Zofran well and it does not to sedate her. Continue Prilosec 20 mg once daily she has not been taking call or any hydrocodone and is encouraged to stay off of both of these medications Reviewed a gastroparesis type III diet 2 followup with Dr. Leone Payor in 2 months or sooner if needed

## 2013-04-09 NOTE — Patient Instructions (Signed)
We have sent the following medications to your pharmacy for you to pick up at your convenience: Reglan, Zofran, Prilosec  Amy stressed that you should take the Zofran twice a day, do not wait until you feel nauseated.  You have been given information on the gastroparesis/low residue diet  Please follow up with Dr. Leone Payor in about 2 months

## 2013-04-13 NOTE — Progress Notes (Signed)
Agree with Ms. Esterwood's assessment and plan. Kaleab Frasier E. Annamaria Salah, MD, FACG   

## 2013-04-15 ENCOUNTER — Telehealth: Payer: Self-pay | Admitting: Internal Medicine

## 2013-04-15 ENCOUNTER — Telehealth: Payer: Self-pay | Admitting: *Deleted

## 2013-04-15 NOTE — Telephone Encounter (Signed)
The patient said Karen Herrera said they could not refill her Zofran.  I called the [address], [place].  They said her insurance plan would only cover 12 tabs in one month.  The pharmacist is going to fax me a prior authorization form. I told [name] that I would call the insurance company tomorrow 04-16-2013 and call her tomorrow with a status. I did not promise I would have an answer tomorrow.

## 2013-04-16 ENCOUNTER — Other Ambulatory Visit: Payer: Self-pay | Admitting: *Deleted

## 2013-04-16 ENCOUNTER — Other Ambulatory Visit: Payer: Self-pay

## 2013-04-16 MED ORDER — ONDANSETRON 4 MG PO TBDP: 4.0000 mg | ORAL_TABLET | Freq: Two times a day (BID) | ORAL | Status: DC

## 2013-04-16 NOTE — Telephone Encounter (Signed)
I called the patient to advise I called [employer] today to do a prior authorization. Gave them the codes for nausea, Gastroparesis, Delayed Gastric emptying.  They would not approve any more than 12 tablets in 30 days because she doesn't have a diagnosis of cancer .  I asked Elvie if she wanted me to send a prescription for 24 tablets.  I told her if the additional 12 tablets are too expensive and she only wants the 12 tab that the ins will cover to let [facility] know.  Marcey was fine with that.

## 2013-04-30 ENCOUNTER — Telehealth: Payer: Self-pay | Admitting: Internal Medicine

## 2013-04-30 NOTE — Telephone Encounter (Signed)
Patient reports nausea/vomiting that started yesterday after she tried to disimpact herself.  She has not been taking anything for her constipation.  She is advised that she needs to take Miralax 1-2 times a day for constipation.  She reports that zofran is hot helping her nausea and she is requesting phenergan.  She has suppositories on her medication list, but they were not prescribed here.   Mike Gip PA please advise

## 2013-05-01 MED ORDER — PROMETHAZINE HCL 25 MG PO TABS: 25.0000 mg | ORAL_TABLET | Freq: Four times a day (QID) | ORAL | Status: DC | PRN

## 2013-05-01 NOTE — Telephone Encounter (Signed)
Phenergan 25 mg tablets or supp  q6-8 hours is fine - # 50 for tabs

## 2013-05-01 NOTE — Telephone Encounter (Signed)
Patient advised rx was sent. ?

## 2013-05-02 ENCOUNTER — Emergency Department (HOSPITAL_COMMUNITY)
Admission: EM | Admit: 2013-05-02 | Discharge: 2013-05-02 | Disposition: A | Discharge status: Home / Self Care | Payer: Managed Care, Other (non HMO) | Attending: Emergency Medicine | Admitting: Emergency Medicine

## 2013-05-02 ENCOUNTER — Encounter (HOSPITAL_COMMUNITY): Payer: Self-pay | Admitting: Emergency Medicine

## 2013-05-02 DIAGNOSIS — Z87891 Personal history of nicotine dependence: Secondary | ICD-10-CM | POA: Insufficient documentation

## 2013-05-02 DIAGNOSIS — Z3202 Encounter for pregnancy test, result negative: Secondary | ICD-10-CM | POA: Insufficient documentation

## 2013-05-02 DIAGNOSIS — I10 Essential (primary) hypertension: Secondary | ICD-10-CM | POA: Insufficient documentation

## 2013-05-02 DIAGNOSIS — R109 Unspecified abdominal pain: Secondary | ICD-10-CM

## 2013-05-02 DIAGNOSIS — Z79899 Other long term (current) drug therapy: Secondary | ICD-10-CM | POA: Insufficient documentation

## 2013-05-02 DIAGNOSIS — R112 Nausea with vomiting, unspecified: Secondary | ICD-10-CM | POA: Insufficient documentation

## 2013-05-02 LAB — COMPREHENSIVE METABOLIC PANEL
AST: 25 U/L (ref 0–37)
Albumin: 4.6 g/dL (ref 3.5–5.2)
BUN: 8 mg/dL (ref 6–23)
CO2: 25 mEq/L (ref 19–32)
Chloride: 99 mEq/L (ref 96–112)
Creatinine, Ser: 0.73 mg/dL (ref 0.50–1.10)
GFR calc Af Amer: >90 mL/min (ref >90)
GFR calc non Af Amer: >90 mL/min (ref >90)
Glucose, Bld: 143 mg/dL — ABNORMAL HIGH (ref 70–99)
Sodium: 140 mEq/L (ref 135–145)
Total Bilirubin: 0.5 mg/dL (ref 0.3–1.2)

## 2013-05-02 LAB — CBC WITH DIFFERENTIAL/PLATELET
Eosinophils Absolute: 0 10*3/uL (ref 0.0–0.7)
Eosinophils Relative: 0 % (ref 0–5)
Lymphocytes Relative: 22 % (ref 12–46)
Lymphs Abs: 1.4 10*3/uL (ref 0.7–4.0)
MCH: 28.8 pg (ref 26.0–34.0)
MCV: 84.1 fL (ref 78.0–100.0)
Platelets: 333 10*3/uL (ref 150–400)
RBC: 4.97 MIL/uL (ref 3.87–5.11)
RDW: 13.2 % (ref 11.5–15.5)

## 2013-05-02 LAB — URINALYSIS, ROUTINE W REFLEX MICROSCOPIC
Bilirubin Urine: NEGATIVE
Glucose, UA: 500 mg/dL — AB
Ketones, ur: >80 mg/dL — AB
Leukocytes, UA: NEGATIVE
Protein, ur: NEGATIVE mg/dL
Specific Gravity, Urine: 1.024 (ref 1.005–1.030)
pH: 8 (ref 5.0–8.0)

## 2013-05-02 LAB — LIPASE, BLOOD: Lipase: 46 U/L (ref 11–59)

## 2013-05-02 MED ORDER — CLONIDINE HCL 0.1 MG PO TABS
0.1000 mg | ORAL_TABLET | Freq: Once | ORAL | Status: AC
  Administered 2013-05-02: 0.1 mg via ORAL
  Filled 2013-05-02: qty 1

## 2013-05-02 MED ORDER — ONDANSETRON 4 MG PO TBDP
4.0000 mg | ORAL_TABLET | Freq: Once | ORAL | Status: AC
  Administered 2013-05-02: 4 mg via ORAL
  Filled 2013-05-02: qty 1

## 2013-05-02 MED ORDER — FENTANYL CITRATE 0.05 MG/ML IJ SOLN
INTRAMUSCULAR | Status: AC
  Administered 2013-05-02: 50 ug
  Filled 2013-05-02: qty 2

## 2013-05-02 MED ORDER — TRAMADOL HCL 50 MG PO TABS: 50.0000 mg | ORAL_TABLET | Freq: Four times a day (QID) | ORAL | Status: DC | PRN

## 2013-05-02 MED ORDER — DICYCLOMINE HCL 10 MG PO CAPS
20.0000 mg | ORAL_CAPSULE | Freq: Once | ORAL | Status: DC
  Filled 2013-05-02: qty 2

## 2013-05-02 MED ORDER — DICYCLOMINE HCL 10 MG PO CAPS
20.0000 mg | ORAL_CAPSULE | Freq: Once | ORAL | Status: AC
  Administered 2013-05-02: 20 mg via ORAL
  Filled 2013-05-02: qty 2

## 2013-05-02 MED ORDER — SODIUM CHLORIDE 0.9 % IV BOLUS (SEPSIS)
1000.0000 mL | Freq: Once | INTRAVENOUS | Status: AC
  Administered 2013-05-02: 1000 mL via INTRAVENOUS

## 2013-05-02 MED ORDER — METOCLOPRAMIDE HCL 5 MG/ML IJ SOLN
10.0000 mg | Freq: Once | INTRAMUSCULAR | Status: AC
  Administered 2013-05-02: 10 mg via INTRAVENOUS
  Filled 2013-05-02: qty 2

## 2013-05-02 MED ORDER — ONDANSETRON HCL 4 MG/2ML IJ SOLN
INTRAMUSCULAR | Status: AC
  Filled 2013-05-02: qty 2

## 2013-05-02 MED ORDER — ONDANSETRON HCL 4 MG/2ML IJ SOLN
4.0000 mg | Freq: Once | INTRAMUSCULAR | Status: AC
Start: 2013-05-02 — End: 2013-05-02
  Administered 2013-05-02: 4 mg via INTRAVENOUS

## 2013-05-02 MED ORDER — PROMETHAZINE HCL 25 MG RE SUPP: 25.0000 mg | Freq: Four times a day (QID) | RECTAL | Status: DC | PRN

## 2013-05-02 MED ORDER — DICYCLOMINE HCL 10 MG PO CAPS: 10.0000 mg | ORAL_CAPSULE | Freq: Once | ORAL | Status: DC

## 2013-05-02 MED ORDER — ONDANSETRON HCL 4 MG/2ML IJ SOLN
INTRAMUSCULAR | Status: AC
  Administered 2013-05-02: 4 mg
  Filled 2013-05-02: qty 2

## 2013-05-02 MED ORDER — TRAMADOL HCL 50 MG PO TABS
50.0000 mg | ORAL_TABLET | Freq: Once | ORAL | Status: AC
  Administered 2013-05-02: 50 mg via ORAL
  Filled 2013-05-02: qty 1

## 2013-05-02 MED ORDER — MORPHINE SULFATE 4 MG/ML IJ SOLN
4.0000 mg | Freq: Once | INTRAMUSCULAR | Status: AC
  Administered 2013-05-02: 4 mg via INTRAVENOUS
  Filled 2013-05-02: qty 1

## 2013-05-02 NOTE — ED Notes (Signed)
Patient discharged with friend instructions given and verbalized an understanding

## 2013-05-02 NOTE — ED Notes (Signed)
Patient with history of gastroparesis, abdominal pain, nausea and vomiting all week.  Patient with increased pain and nausea.  Patient spoke with PCP, was given Rx for phenergan with no relief, she is unable to keep the pills down.

## 2013-05-02 NOTE — ED Provider Notes (Signed)
Medical screening examination/treatment/procedure(s) were conducted as a shared visit with non-physician practitioner(s) and myself.  I personally evaluated the patient during the encounter.  EKG Interpretation   None     no acute abdomen.  Known dx of gastroparesis.  F/u with GI   Donnetta Hutching, MD 05/02/13 [phone]

## 2013-05-02 NOTE — ED Provider Notes (Signed)
CSN: [id number]     Arrival date & time 05/02/13  0430 History   None    Chief Complaint  Patient presents with  . Nausea  . Emesis  . Abdominal Pain   (Consider location/radiation/quality/duration/timing/severity/associated sxs/prior Treatment) Patient is a 27 y.o. female presenting with vomiting and abdominal pain. The history is provided by the patient and medical records.  Emesis Associated symptoms: abdominal pain   Abdominal Pain Associated symptoms: nausea and vomiting    This is a 27 year old female with past medical history significant for hypertension, GERD, gastroparesis, presenting to the ED for recurrent cramping abdominal pain, nausea, vomiting for the past 4 days.  Patient states she seems to get flares of her gastroparesis possibly once a month. She contacted her primary care physician who changed her medications but she has been unable to tolerate any of them PO.  She has been unable to keep any solid of liquid down for the past 4 days.  Was constipated earlier in the week but has been having normal bowel movements since then.  Is freely passing flatus.  Denies any urinary sx or vaginal complaints.  No recent fevers, sweats, or chills.  Past Medical History  Diagnosis Date  . GERD (gastroesophageal reflux disease)   . Hypertension   . Gastroparesis   . Delayed gastric emptying 04/03/2013    Gastric emptying study, [facility], a 20 01/28/2013 with 7% emptying at 1 hour and 42% at 2 hours.   Past Surgical History  Procedure Laterality Date  . Upper gi endoscopy  August 2014    Normal   Family History  Problem Relation Age of Onset  . Hypertension Mother   . Hypertension Father   . Breast cancer Maternal Grandmother     great grand  . Diabetes Maternal Grandmother   . Heart attack Maternal Grandmother    History  Substance Use Topics  . Smoking status: Former Smoker    Types: Cigarettes    Quit date: 06/22/2011  . Smokeless tobacco: Never Used   . Alcohol Use: No   OB History   Grav Para Term Preterm Abortions TAB SAB Ect Mult Living                 Review of Systems  Gastrointestinal: Positive for nausea, vomiting and abdominal pain.  All other systems reviewed and are negative.    Allergies  Cephalexin  Home Medications   Current Outpatient Rx  Name  Route  Sig  Dispense  Refill  . metoCLOPramide (REGLAN) 10 MG tablet   Oral   Take 5 mg by mouth 4 (four) times daily -  before meals and at bedtime.         Marland Kitchen omeprazole (PRILOSEC) 20 MG capsule   Oral   Take 1 capsule (20 mg total) by mouth daily.   30 capsule   6   . ondansetron (ZOFRAN ODT) 4 MG disintegrating tablet   Oral   Take 1 tablet (4 mg total) by mouth 2 (two) times daily.   24 tablet   1   . promethazine (PHENERGAN) 25 MG suppository   Rectal   Place 25 mg rectally every 6 (six) hours as needed for nausea.         . promethazine (PHENERGAN) 25 MG tablet   Oral   Take 1 tablet (25 mg total) by mouth every 6 (six) hours as needed for nausea or vomiting.   50 tablet   0    BP  154/104  Pulse 95  Temp(Src) 98.6 F (37 C) (Oral)  Resp 22  SpO2 94%  LMP 04/24/2013  Physical Exam  Nursing note and vitals reviewed. Constitutional: She is oriented to person, place, and time. She appears well-developed and well-nourished. No distress.  HENT:  Head: Normocephalic and atraumatic.  Mouth/Throat: Oropharynx is clear and moist.  Mildly dry mucus membranes  Eyes: Conjunctivae and EOM are normal. Pupils are equal, round, and reactive to light.  Neck: Normal range of motion. Neck supple.  Cardiovascular: Normal rate, regular rhythm and normal heart sounds.   Pulmonary/Chest: Effort normal and breath sounds normal. No respiratory distress. She has no wheezes.  Abdominal: Soft. Bowel sounds are normal. There is no tenderness. There is no guarding.  Abdomen soft, nondistended, no peritoneal signs  Musculoskeletal: Normal range of motion.   Neurological: She is alert and oriented to person, place, and time.  Skin: Skin is warm and dry. She is not diaphoretic.  Psychiatric: She has a normal mood and affect.    ED Course  Procedures (including critical care time) Labs Review Labs Reviewed  CBC WITH DIFFERENTIAL - Abnormal; Notable for the following:    Monocytes Relative 2 (*)    All other components within normal limits  URINALYSIS, ROUTINE W REFLEX MICROSCOPIC - Abnormal; Notable for the following:    Glucose, UA 500 (*)    Ketones, ur >80 (*)    All other components within normal limits  COMPREHENSIVE METABOLIC PANEL - Abnormal; Notable for the following:    Glucose, Bld 143 (*)    All other components within normal limits  LIPASE, BLOOD  POCT PREGNANCY, URINE   Imaging Review No results found.  EKG Interpretation   None       MDM   1. Nausea & vomiting   2. Abdominal cramping    On initial evaluation, pt lying comfortably in bed, NAD.  Meds given.  Labs pending.  BP elevated, possibly due to pain.  Will reassess.  On re-evaluation, pts BP remains elevated.  States she does not take any home medications for her HTN.  Will give dose of clonidine in the ED.  After discussing this with pt, she began writhing in bed stating she is in a lot of pain and is very nauseated.  Bentyl ordered.  Pt has had no episodes of active vomiting in the ED.  She has been fluid resuscitated and given meds for pain and nausea.  She has tolerated PO without difficulty.  BP has improved, now 153/98.  Pt afebrile, non-toxic appearing, NAD, VS stable- ok for discharge.  Instructed to FU with her PCP for re-check of BP and monitoring of abdominal sx.  Discussed plan with pt, she agreed.  Return precautions advised.  Pt upset and tearful at time of discharge-- states she is frustrated with her condition because there does not seem to be a permanent solution.  States she calls her GI physician and PCP numerous times and has been started  on new meds but nothing ever seems to improve her condition.  I have advised pt that i understand her frustration as this is a chronic condition but her sx are well controlled at this time and no further intervention is required.  Pt request further meds for home-- tramadol and phenergan suppositories.  Again advised to FU with GI and/or PCP.  Garlon Hatchet, PA-C 05/02/13 [phone]

## 2013-05-04 ENCOUNTER — Telehealth: Payer: Self-pay | Admitting: Internal Medicine

## 2013-05-04 NOTE — Telephone Encounter (Signed)
Patient needs an office visit to discuss additional concerns.  She will come on 05/22/13 2;15

## 2013-05-22 ENCOUNTER — Encounter: Payer: Self-pay | Admitting: Internal Medicine

## 2013-05-22 ENCOUNTER — Ambulatory Visit (INDEPENDENT_AMBULATORY_CARE_PROVIDER_SITE_OTHER): Payer: Managed Care, Other (non HMO) | Admitting: Internal Medicine

## 2013-05-22 VITALS — BP 124/70 | HR 78 | Ht 63.0 in | Wt 118.0 lb

## 2013-05-22 DIAGNOSIS — F419 Anxiety disorder, unspecified: Secondary | ICD-10-CM | POA: Insufficient documentation

## 2013-05-22 DIAGNOSIS — F411 Generalized anxiety disorder: Secondary | ICD-10-CM

## 2013-05-22 DIAGNOSIS — K3184 Gastroparesis: Secondary | ICD-10-CM

## 2013-05-22 DIAGNOSIS — R1013 Epigastric pain: Secondary | ICD-10-CM

## 2013-05-22 MED ORDER — LORAZEPAM 1 MG PO TABS: 1.0000 mg | ORAL_TABLET | Freq: Three times a day (TID) | ORAL | Status: DC

## 2013-05-22 MED ORDER — METOCLOPRAMIDE HCL 10 MG PO TABS: 5.0000 mg | ORAL_TABLET | Freq: Three times a day (TID) | ORAL | Status: DC

## 2013-05-22 MED ORDER — DIMENHYDRINATE 50 MG PO CHEW: 1.0000 | CHEWABLE_TABLET | ORAL | Status: DC | PRN

## 2013-05-22 NOTE — Progress Notes (Signed)
    Subjective:    Patient ID: Karen Herrera, female    DOB: 09/18/1985, 27 y.o.   MRN: [id number]  HPI Patient returns with her mother today for followup of gastric harasses. She is doing much better on 5 mg metoclopramide before meals and at bedtime. She is concerned about potential side effects, particularly tardive dyskinesia. She's not had any problems like that. She is much better but she still has some nausea. She did have to go to the hospital ER again for IV fluids because she developed severe nausea and vomiting problems around that time. She believes that she gets worse around the time of her menses. She had called [place] but we did not call back in time for her to prevent an episode, she had one at promethazine suppository which she now has on hand. Does do seem to quell severe nausea and vomiting. She is about to go on a week trip to [place]. She does admit to some anxiety about the long-term prognosis of her disorder and the need to take medications with possible side effects. She will get epigastric pain after she vomits. Medications, allergies, past medical history, past surgical history, family history and social history are reviewed and updated in the EMR.   Review of Systems Wt Readings from Last 3 Encounters:  05/22/13 118 lb (53.524 kg)  04/09/13 114 lb (51.71 kg)  03/26/13 120 lb (54.432 kg)      Objective:   Physical Exam NAD    Assessment & Plan:   1. Gastroparesis   2. Anxiety

## 2013-05-22 NOTE — Assessment & Plan Note (Addendum)
Better Concerned about se's Worse during menstrual cycle To stay on metaclopramide for now and when back from [place] try to reduce dose To try BCP as one small study suggested better on this Also try dimenhydrinate

## 2013-05-22 NOTE — Assessment & Plan Note (Signed)
This is related to the vomiting episodes. She may use tramadol, it is certainly okay for her to use an anti-inflammatory as well as she does not have ulcer disease.

## 2013-05-22 NOTE — Assessment & Plan Note (Signed)
It's understandable that she would have some of this. I have prescribed lorazepam which may help nausea as well.

## 2013-05-22 NOTE — Patient Instructions (Addendum)
Today you have been given information on gastroparesis to read and show your GYN Dr.    After you get back from your trip try and cut down on your generic reglan.  We have sent  medications to your pharmacy for you to pick up at your convenience.  Lorazepam has been giving to you in written form to take to the pharmacy to fill.   Follow up with [place] in February.   I appreciate the opportunity to care for you.

## 2013-06-19 ENCOUNTER — Telehealth: Payer: Self-pay | Admitting: Internal Medicine

## 2013-06-19 ENCOUNTER — Encounter: Payer: Self-pay | Admitting: Internal Medicine

## 2013-06-19 NOTE — Telephone Encounter (Signed)
Patient c/o intermittent abdominal pain.  She takes tramadol, but it does not help with the pain.  She is offered an appt with Dr. Leone PayorGessner for next week, but she declines.  She is scheduled for 07/28/13 4:00 to discuss.  She will call back if she can work out an earlier appt

## 2013-06-26 ENCOUNTER — Telehealth: Payer: Self-pay | Admitting: Internal Medicine

## 2013-06-26 MED ORDER — PROMETHAZINE HCL 25 MG PO TABS: 25.0000 mg | ORAL_TABLET | Freq: Four times a day (QID) | ORAL | Status: DC | PRN

## 2013-06-26 NOTE — Telephone Encounter (Signed)
Sir may I refill her phenergan pills rx please, has Feb. Appointment.

## 2013-06-26 NOTE — Telephone Encounter (Signed)
Refill for one month 

## 2013-07-28 ENCOUNTER — Ambulatory Visit: Payer: Managed Care, Other (non HMO) | Admitting: Internal Medicine

## 2013-08-11 ENCOUNTER — Telehealth: Payer: Self-pay | Admitting: Internal Medicine

## 2013-08-11 MED ORDER — TRAMADOL HCL 50 MG PO TABS: 50.0000 mg | ORAL_TABLET | Freq: Four times a day (QID) | ORAL | Status: DC | PRN

## 2013-08-11 NOTE — Telephone Encounter (Signed)
Ok to refill x 2  

## 2013-08-11 NOTE — Telephone Encounter (Signed)
Patient informed that rx for tramadol 50mg  [facility] [address] Per her request.

## 2013-08-11 NOTE — Telephone Encounter (Signed)
Please advise Sir if ok to refill thru upcoming appointment, thank you.

## 2013-08-31 ENCOUNTER — Telehealth: Payer: Self-pay | Admitting: Internal Medicine

## 2013-08-31 NOTE — Telephone Encounter (Signed)
Patient reports that she had some vomiting last week and a flare of gastroparesis.  No vomiting since last week, but she is having pain and burning.  She is advised to continue her medications, keep follow up on 09/22/13, and start back on step 1 of gastroparesis diet and slowly advance once the pain has resolved.  Dr. Leone PayorGessner do you have any additional recommendations?

## 2013-09-01 NOTE — Telephone Encounter (Signed)
Pain and burning could be abdominal wall from vomiting  Purchase OTC cream with trolamine salicylate and apply to abdominal wall tid

## 2013-09-01 NOTE — Telephone Encounter (Signed)
Left message for patient to call back  

## 2013-09-01 NOTE — Telephone Encounter (Signed)
Patient notified of Dr. Marvell Fullergessner's recommendations

## 2013-09-07 ENCOUNTER — Encounter (HOSPITAL_COMMUNITY): Payer: Self-pay | Admitting: Emergency Medicine

## 2013-09-07 ENCOUNTER — Emergency Department (HOSPITAL_COMMUNITY)
Admission: EM | Admit: 2013-09-07 | Discharge: 2013-09-07 | Disposition: A | Discharge status: Home / Self Care | Payer: Managed Care, Other (non HMO) | Attending: Emergency Medicine | Admitting: Emergency Medicine

## 2013-09-07 ENCOUNTER — Telehealth: Payer: Self-pay | Admitting: Internal Medicine

## 2013-09-07 DIAGNOSIS — K219 Gastro-esophageal reflux disease without esophagitis: Secondary | ICD-10-CM | POA: Insufficient documentation

## 2013-09-07 DIAGNOSIS — I10 Essential (primary) hypertension: Secondary | ICD-10-CM | POA: Insufficient documentation

## 2013-09-07 DIAGNOSIS — Z87891 Personal history of nicotine dependence: Secondary | ICD-10-CM | POA: Insufficient documentation

## 2013-09-07 DIAGNOSIS — E860 Dehydration: Secondary | ICD-10-CM | POA: Insufficient documentation

## 2013-09-07 DIAGNOSIS — Z79899 Other long term (current) drug therapy: Secondary | ICD-10-CM | POA: Insufficient documentation

## 2013-09-07 DIAGNOSIS — F411 Generalized anxiety disorder: Secondary | ICD-10-CM | POA: Insufficient documentation

## 2013-09-07 DIAGNOSIS — K3184 Gastroparesis: Secondary | ICD-10-CM

## 2013-09-07 DIAGNOSIS — Z3202 Encounter for pregnancy test, result negative: Secondary | ICD-10-CM | POA: Insufficient documentation

## 2013-09-07 LAB — CBC WITH DIFFERENTIAL/PLATELET
BASOS PCT: 0 % (ref 0–1)
Basophils Absolute: 0 10*3/uL (ref 0.0–0.1)
EOS ABS: 0 10*3/uL (ref 0.0–0.7)
Eosinophils Relative: 0 % (ref 0–5)
HCT: 41.3 % (ref 36.0–46.0)
HEMOGLOBIN: 14.4 g/dL (ref 12.0–15.0)
Lymphocytes Relative: 10 % — ABNORMAL LOW (ref 12–46)
Lymphs Abs: 1.1 10*3/uL (ref 0.7–4.0)
MCH: 29.3 pg (ref 26.0–34.0)
MCHC: 34.9 g/dL (ref 30.0–36.0)
MCV: 84.1 fL (ref 78.0–100.0)
MONOS PCT: 2 % — AB (ref 3–12)
Monocytes Absolute: 0.3 10*3/uL (ref 0.1–1.0)
NEUTROS PCT: 88 % — AB (ref 43–77)
Neutro Abs: 9.8 10*3/uL — ABNORMAL HIGH (ref 1.7–7.7)
PLATELETS: 307 10*3/uL (ref 150–400)
RBC: 4.91 MIL/uL (ref 3.87–5.11)
RDW: 12.8 % (ref 11.5–15.5)
WBC: 11.2 10*3/uL — ABNORMAL HIGH (ref 4.0–10.5)

## 2013-09-07 LAB — URINALYSIS, ROUTINE W REFLEX MICROSCOPIC
Bilirubin Urine: NEGATIVE
Glucose, UA: 500 mg/dL — AB
Hgb urine dipstick: NEGATIVE
Ketones, ur: 40 mg/dL — AB
LEUKOCYTES UA: NEGATIVE
Nitrite: NEGATIVE
PH: 7 (ref 5.0–8.0)
Protein, ur: NEGATIVE mg/dL
Specific Gravity, Urine: 1.027 (ref 1.005–1.030)
Urobilinogen, UA: 0.2 mg/dL (ref 0.0–1.0)

## 2013-09-07 LAB — COMPREHENSIVE METABOLIC PANEL
ALBUMIN: 4.5 g/dL (ref 3.5–5.2)
ALK PHOS: 72 U/L (ref 39–117)
ALT: 15 U/L (ref 0–35)
AST: 21 U/L (ref 0–37)
BUN: 8 mg/dL (ref 6–23)
CALCIUM: 9.6 mg/dL (ref 8.4–10.5)
CO2: 23 mEq/L (ref 19–32)
CREATININE: 0.62 mg/dL (ref 0.50–1.10)
Chloride: 99 mEq/L (ref 96–112)
GFR calc Af Amer: >90 mL/min (ref >90)
GFR calc non Af Amer: >90 mL/min (ref >90)
Glucose, Bld: 128 mg/dL — ABNORMAL HIGH (ref 70–99)
POTASSIUM: 4.1 meq/L (ref 3.7–5.3)
Sodium: 141 mEq/L (ref 137–147)
Total Bilirubin: 0.3 mg/dL (ref 0.3–1.2)
Total Protein: 7.7 g/dL (ref 6.0–8.3)

## 2013-09-07 LAB — LIPASE, BLOOD: Lipase: 23 U/L (ref 11–59)

## 2013-09-07 LAB — POC URINE PREG, ED: PREG TEST UR: NEGATIVE

## 2013-09-07 MED ORDER — METOCLOPRAMIDE HCL 5 MG/ML IJ SOLN
10.0000 mg | Freq: Once | INTRAMUSCULAR | Status: AC
  Administered 2013-09-07: 10 mg via INTRAVENOUS
  Filled 2013-09-07: qty 2

## 2013-09-07 MED ORDER — LORAZEPAM 2 MG/ML IJ SOLN
1.0000 mg | Freq: Once | INTRAMUSCULAR | Status: AC
  Administered 2013-09-07: 1 mg via INTRAVENOUS
  Filled 2013-09-07: qty 1

## 2013-09-07 MED ORDER — ONDANSETRON 4 MG PO TBDP
8.0000 mg | ORAL_TABLET | Freq: Once | ORAL | Status: AC
  Administered 2013-09-07: 8 mg via ORAL
  Filled 2013-09-07: qty 2

## 2013-09-07 MED ORDER — PROMETHAZINE HCL 25 MG RE SUPP
25.0000 mg | Freq: Once | RECTAL | Status: AC
  Administered 2013-09-07: 25 mg via RECTAL
  Filled 2013-09-07: qty 1

## 2013-09-07 MED ORDER — ONDANSETRON HCL 4 MG/2ML IJ SOLN
4.0000 mg | Freq: Once | INTRAMUSCULAR | Status: AC
  Administered 2013-09-07: 4 mg via INTRAVENOUS
  Filled 2013-09-07: qty 2

## 2013-09-07 MED ORDER — PROMETHAZINE HCL 25 MG PO TABS: 25.0000 mg | ORAL_TABLET | Freq: Four times a day (QID) | ORAL | Status: AC | PRN

## 2013-09-07 MED ORDER — SODIUM CHLORIDE 0.9 % IV BOLUS (SEPSIS)
1000.0000 mL | Freq: Once | INTRAVENOUS | Status: AC
  Administered 2013-09-07: 1000 mL via INTRAVENOUS

## 2013-09-07 MED ORDER — FENTANYL CITRATE 0.05 MG/ML IJ SOLN
50.0000 ug | Freq: Once | INTRAMUSCULAR | Status: AC
  Administered 2013-09-07: 50 ug via NASAL
  Filled 2013-09-07: qty 2

## 2013-09-07 NOTE — ED Provider Notes (Signed)
CSN: [id number]     Arrival date & time 09/07/13  1618 History   First MD Initiated Contact with Patient 09/07/13 1711     Chief Complaint  Patient presents with  . Abdominal Pain  . Emesis     (Consider location/radiation/quality/duration/timing/severity/associated sxs/prior Treatment) Patient is a 28 y.o. female presenting with vomiting. The history is provided by the patient.  Emesis Severity:  Moderate Duration:  1 day Timing:  Constant Number of daily episodes:  > 10 Quality:  Stomach contents Progression:  Unchanged Chronicity:  Recurrent (pt with hx of gastroparesis, states this feels exactly like a flare) Recent urination:  Normal Relieved by:  Nothing Worsened by:  Liquids Ineffective treatments:  Antiemetics Associated symptoms: abdominal pain (epigastric)   Associated symptoms: no chills, no cough, no diarrhea and no fever   Abdominal pain:    Location:  Epigastric   Quality:  Unable to specify   Severity:  Mild   Onset quality:  Gradual   Duration:  1 day   Timing:  Constant   Progression:  Unchanged   Chronicity:  Recurrent Risk factors: no diabetes, not pregnant now and no prior abdominal surgery     Past Medical History  Diagnosis Date  . GERD (gastroesophageal reflux disease)   . Hypertension   . Gastroparesis   . Delayed gastric emptying 04/03/2013    Gastric emptying study, [facility], a 20 01/28/2013 with 7% emptying at 1 hour and 42% at 2 hours.   Past Surgical History  Procedure Laterality Date  . Upper gi endoscopy  August 2014    Normal   Family History  Problem Relation Age of Onset  . Hypertension Mother   . Hypertension Father   . Breast cancer Maternal Grandmother     great grand  . Diabetes Maternal Grandmother   . Heart attack Maternal Grandmother    History  Substance Use Topics  . Smoking status: Former Smoker    Types: Cigarettes    Quit date: 06/22/2011  . Smokeless tobacco: Never Used  . Alcohol Use: No    OB History   Grav Para Term Preterm Abortions TAB SAB Ect Mult Living                 Review of Systems  Constitutional: Negative for chills and activity change.  HENT: Negative for congestion and rhinorrhea.   Eyes: Negative for discharge, redness and itching.  Respiratory: Negative for cough, shortness of breath and wheezing.   Cardiovascular: Negative for chest pain and palpitations.  Gastrointestinal: Positive for nausea, vomiting and abdominal pain (epigastric). Negative for diarrhea and constipation.  Genitourinary: Negative for dysuria, hematuria and decreased urine volume.  Skin: Negative for rash and wound.  Neurological: Negative for syncope and weakness.  Psychiatric/Behavioral: The patient is nervous/anxious.       Allergies  Cephalexin  Home Medications   Current Outpatient Rx  Name  Route  Sig  Dispense  Refill  . metoCLOPramide (REGLAN) 10 MG tablet   Oral   Take 0.5 tablets (5 mg total) by mouth 4 (four) times daily -  before meals and at bedtime.   90 tablet   3   . omeprazole (PRILOSEC) 20 MG capsule   Oral   Take 1 capsule (20 mg total) by mouth daily.   30 capsule   6   . ondansetron (ZOFRAN ODT) 4 MG disintegrating tablet   Oral   Take 1 tablet (4 mg total) by mouth 2 (two) times  daily.   24 tablet   1   . promethazine (PHENERGAN) 25 MG suppository   Rectal   Place 1 suppository (25 mg total) rectally every 6 (six) hours as needed for nausea or vomiting.   12 each   0   . promethazine (PHENERGAN) 25 MG tablet   Oral   Take 1 tablet (25 mg total) by mouth every 6 (six) hours as needed for nausea or vomiting.   50 tablet   0   . traMADol (ULTRAM) 50 MG tablet   Oral   Take 50 mg by mouth daily as needed for moderate pain.         . promethazine (PHENERGAN) 25 MG tablet   Oral   Take 1 tablet (25 mg total) by mouth every 6 (six) hours as needed for nausea or vomiting.   30 tablet   0    BP 170/115  Pulse 82  Temp(Src) 98.3  F (36.8 C) (Oral)  Resp 16  Ht 5\' 3"  (1.6 m)  Wt 120 lb (54.432 kg)  BMI 21.26 kg/m2  SpO2 99%  LMP 08/03/2013 Physical Exam  Constitutional: She is oriented to person, place, and time. She appears well-developed and well-nourished. No distress.  Pt very anxious,NAD  Mild dehydration  HENT:  Head: Normocephalic and atraumatic.  Mouth/Throat: Oropharynx is clear and moist.  Eyes: Conjunctivae and EOM are normal. Pupils are equal, round, and reactive to light. Right eye exhibits no discharge. Left eye exhibits no discharge. No scleral icterus.  Neck: Normal range of motion. Neck supple.  Cardiovascular: Normal rate, regular rhythm and intact distal pulses.  Exam reveals no gallop and no friction rub.   No murmur heard. Pulmonary/Chest: Effort normal and breath sounds normal. No respiratory distress. She has no wheezes. She has no rales.  Abdominal: Soft. She exhibits no distension and no mass. There is tenderness.  Mild epigastric ttp, no rigidity, no peritonitis, soft, neg murphy, neg mcburney  Musculoskeletal: Normal range of motion.  Neurological: She is alert and oriented to person, place, and time. No cranial nerve deficit. She exhibits normal muscle tone. Coordination normal.  Skin: She is not diaphoretic.    ED Course  Procedures (including critical care time) Labs Review Labs Reviewed  CBC WITH DIFFERENTIAL - Abnormal; Notable for the following:    WBC 11.2 (*)    Neutrophils Relative % 88 (*)    Neutro Abs 9.8 (*)    Lymphocytes Relative 10 (*)    Monocytes Relative 2 (*)    All other components within normal limits  COMPREHENSIVE METABOLIC PANEL - Abnormal; Notable for the following:    Glucose, Bld 128 (*)    All other components within normal limits  URINALYSIS, ROUTINE W REFLEX MICROSCOPIC - Abnormal; Notable for the following:    APPearance HAZY (*)    Glucose, UA 500 (*)    Ketones, ur 40 (*)    All other components within normal limits  LIPASE, BLOOD   POC URINE PREG, ED   Imaging Review No results found.   EKG Interpretation None      MDM   MDM: 28 y.o. AAF w/ PMHx of gastroparesis, HTN, w/ cc: of gastroparesis flare. Pt states she has abd pain  In her epigastric area for a few hours. States that she has been having nbnb vomiting, nauseous, and mild epigastric abd pain. No diarrhea, f/c, chest pain, SOB. Hx of similar. Abf benign, AFVSS. Anxious. Given Ativan, Reglan, Zofran and fluids. Also given  Fentanyl for pain. Labs WNL. UA WNL. Pt with no further vomiting after meds, well appearing, NAD, states she feels much better. Re-examination of abdomen remains benign. Vitalsi mproved. Likely gastroparesis flare. Requesting going home with Phenergan rx. Given Phenergan rx for sxs. Has f/u w/ Dr. Leone Payor in 09/22/13. Return to ED if sxs worsen. Dischargd. Care of case d/w my attending.  Final diagnoses:  Gastroparesis    Discharged    Pilar Jarvis, MD 09/07/13 1949

## 2013-09-07 NOTE — ED Notes (Signed)
Upon d/c, pt c/o abd pain and nausea, Dr. Marcha SoldersBrtalik notified and in to speak [facility] pt[facility] Pt alert, NAD, calm, interactive.

## 2013-09-07 NOTE — Telephone Encounter (Signed)
Patient reports that she is still having abdominal pain and vomiting.  She is advised to start on Step 1 of gastroparesis diet.  She states she is following it, but she is drinking several cans of Ensure a day.  I reviewed with her that she needs to avoid all milk products at this time.  She is asked to look for OTC products in the meal replacement section that are clear ([employer] makes some).  She reports that zofran does not help and she can't take phenergan and work.  Would like another alternative.  She has follow up with you on 09/22/13 do you have any additional recommendations at this time?

## 2013-09-07 NOTE — Discharge Instructions (Signed)
Gastroparesis  Gastroparesis is also called slowed stomach emptying (delayed gastric emptying). It is a condition in which the stomach takes too long to empty its contents. It often happens in people with diabetes.  CAUSES  Gastroparesis happens when nerves to the stomach are damaged or stop working. When the nerves are damaged, the muscles of the stomach and intestines do not work normally. The movement of food is slowed or stopped. High blood glucose (sugar) causes changes in nerves and can damage the blood vessels that carry oxygen and nutrients to the nerves. RISK FACTORS  Diabetes.  Post-viral syndromes.  Eating disorders (anorexia, bulimia).  Surgery on the stomach or vagus nerve.  Gastroesophageal reflux disease (rarely).  Smooth muscle disorders (amyloidosis, scleroderma).  Metabolic disorders, including hypothyroidism.  Parkinson disease. SYMPTOMS   Heartburn.  Feeling sick to your stomach (nausea).  Vomiting of undigested food.  An early feeling of fullness when eating.  Weight loss.  Abdominal bloating.  Erratic blood glucose levels.  Lack of appetite.  Gastroesophageal reflux.  Spasms of the stomach wall. Complications can include:  Bacterial overgrowth in stomach. Food stays in the stomach and can ferment and cause bacteria to grow.  Weight loss due to difficulty digesting and absorbing nutrients.  Vomiting.  Obstruction in the stomach. Undigested food can harden and cause nausea and vomiting.  Blood glucose fluctuations caused by inconsistent food absorption. DIAGNOSIS  The diagnosis of gastroparesis is confirmed through one or more of the following tests:  Barium X-rays and scans. These tests look at how long it takes for food to move through the stomach.  Gastric manometry. This test measures electrical and muscular activity in the stomach. A thin tube is passed down the throat into the stomach. The tube contains a wire that takes measurements  of the stomach's electrical and muscular activity as it digests liquids and solid food.  Endoscopy. This procedure is done with a long, thin tube called an endoscope. It is passed through the mouth and gently down the esophagus into the stomach. This tube helps the caregiver look at the lining of the stomach to check for any abnormalities.  Ultrasonography. This can rule out gallbladder disease or pancreatitis. This test will outline and define the shape of the gallbladder and pancreas. TREATMENT   Treatments may include:  Exercise.  Medicines to control nausea and vomiting.  Medicines to stimulate stomach muscles.  Changes in what and when you eat.  Having smaller meals more often.  Eating low-fiber forms of high-fiber foods, such as eating cooked vegetables instead of raw vegetables.  Eating low-fat foods.  Consuming liquids, which are easier to digest.  In severe cases, feeding tubes and intravenous (IV) feeding may be needed. It is important to note that in most cases, treatment does not cure gastroparesis. It is usually a lasting (chronic) condition. Treatment helps you manage the underlying condition so that you can be as healthy and comfortable as possible. Other treatments  A gastric neurostimulator has been developed to assist people with gastroparesis. The battery-operated device is surgically implanted. It emits mild electrical pulses to help improve stomach emptying and to control nausea and vomiting.  The use of botulinum toxin has been shown to improve stomach emptying by decreasing the prolonged contractions of the muscle between the stomach and the small intestine (pyloric sphincter). The benefits are temporary. SEEK MEDICAL CARE IF:   You have diabetes and you are having problems keeping your blood glucose in goal range.  You are having nausea,   vomiting, bloating, or early feelings of fullness with eating.  Your symptoms do not change with a change in  diet. Document Released: 05/28/2005 Document Revised: 09/22/2012 Document Reviewed: 11/04/2008 [employer] Patient Information 2014 [employer].  

## 2013-09-07 NOTE — ED Notes (Signed)
Spoke with lab about adding on lipase. Velna HatchetSheila in the lab stated she would take care of it.

## 2013-09-07 NOTE — ED Notes (Signed)
Pt reports n/v that started a couple of days ago. Reports that she is unable to hold anything down. States that she had a similar episode last week. Denies any urinary symptoms at this time. Hx of gastroparesis

## 2013-09-08 NOTE — Telephone Encounter (Signed)
She is taking reglan 5 mg qid.  She did go to the ER yesterday.  They gave her pain meds,, fluids, and phenergan and sent her home with instructions to follow up with you on 4/14

## 2013-09-08 NOTE — Telephone Encounter (Signed)
I do not have any other recommendations at this time I will discuss a referral to Dr. Alycia RossettiKoch at her visit

## 2013-09-08 NOTE — Telephone Encounter (Signed)
Is she taking metaclopramide?

## 2013-09-08 NOTE — ED Provider Notes (Signed)
This patient was seen in conjunction with the resident physician, Dr. Marcha SoldersBrtalik.  The documentation accurately reflects the patient's ED course.  On my exam the patient was resting in no distress.  Karen Munchobert Darrious Youman, MD 09/08/13 [phone]

## 2013-09-09 ENCOUNTER — Encounter (HOSPITAL_COMMUNITY): Payer: Self-pay | Admitting: Emergency Medicine

## 2013-09-09 ENCOUNTER — Emergency Department (HOSPITAL_COMMUNITY)
Admission: EM | Admit: 2013-09-09 | Discharge: 2013-09-09 | Disposition: A | Discharge status: Home / Self Care | Payer: Managed Care, Other (non HMO) | Attending: Emergency Medicine | Admitting: Emergency Medicine

## 2013-09-09 DIAGNOSIS — K3184 Gastroparesis: Secondary | ICD-10-CM

## 2013-09-09 DIAGNOSIS — Z3202 Encounter for pregnancy test, result negative: Secondary | ICD-10-CM | POA: Insufficient documentation

## 2013-09-09 DIAGNOSIS — Z79899 Other long term (current) drug therapy: Secondary | ICD-10-CM | POA: Insufficient documentation

## 2013-09-09 DIAGNOSIS — Z87891 Personal history of nicotine dependence: Secondary | ICD-10-CM | POA: Insufficient documentation

## 2013-09-09 DIAGNOSIS — R112 Nausea with vomiting, unspecified: Secondary | ICD-10-CM | POA: Insufficient documentation

## 2013-09-09 LAB — CBC WITH DIFFERENTIAL/PLATELET
BASOS ABS: 0 10*3/uL (ref 0.0–0.1)
Basophils Relative: 0 % (ref 0–1)
Eosinophils Absolute: 0 10*3/uL (ref 0.0–0.7)
Eosinophils Relative: 0 % (ref 0–5)
HCT: 44.3 % (ref 36.0–46.0)
Hemoglobin: 15.5 g/dL — ABNORMAL HIGH (ref 12.0–15.0)
LYMPHS ABS: 0.9 10*3/uL (ref 0.7–4.0)
LYMPHS PCT: 10 % — AB (ref 12–46)
MCH: 29.7 pg (ref 26.0–34.0)
MCHC: 35 g/dL (ref 30.0–36.0)
MCV: 84.9 fL (ref 78.0–100.0)
Monocytes Absolute: 0.2 10*3/uL (ref 0.1–1.0)
Monocytes Relative: 2 % — ABNORMAL LOW (ref 3–12)
NEUTROS PCT: 88 % — AB (ref 43–77)
Neutro Abs: 7.3 10*3/uL (ref 1.7–7.7)
PLATELETS: 292 10*3/uL (ref 150–400)
RBC: 5.22 MIL/uL — ABNORMAL HIGH (ref 3.87–5.11)
RDW: 12.8 % (ref 11.5–15.5)
WBC: 8.4 10*3/uL (ref 4.0–10.5)

## 2013-09-09 LAB — URINALYSIS, ROUTINE W REFLEX MICROSCOPIC
Glucose, UA: 100 mg/dL — AB
HGB URINE DIPSTICK: NEGATIVE
Ketones, ur: >80 mg/dL — AB
Leukocytes, UA: NEGATIVE
NITRITE: NEGATIVE
PROTEIN: 30 mg/dL — AB
SPECIFIC GRAVITY, URINE: 1.03 (ref 1.005–1.030)
Urobilinogen, UA: 0.2 mg/dL (ref 0.0–1.0)
pH: 6 (ref 5.0–8.0)

## 2013-09-09 LAB — URINE MICROSCOPIC-ADD ON

## 2013-09-09 LAB — COMPREHENSIVE METABOLIC PANEL
ALK PHOS: 68 U/L (ref 39–117)
ALT: 18 U/L (ref 0–35)
AST: 31 U/L (ref 0–37)
Albumin: 4.5 g/dL (ref 3.5–5.2)
BUN: 14 mg/dL (ref 6–23)
CO2: 23 meq/L (ref 19–32)
Calcium: 9.8 mg/dL (ref 8.4–10.5)
Chloride: 96 mEq/L (ref 96–112)
Creatinine, Ser: 0.74 mg/dL (ref 0.50–1.10)
GLUCOSE: 105 mg/dL — AB (ref 70–99)
POTASSIUM: 4.6 meq/L (ref 3.7–5.3)
SODIUM: 136 meq/L — AB (ref 137–147)
Total Bilirubin: 0.4 mg/dL (ref 0.3–1.2)
Total Protein: 8.3 g/dL (ref 6.0–8.3)

## 2013-09-09 LAB — LIPASE, BLOOD: Lipase: 31 U/L (ref 11–59)

## 2013-09-09 LAB — POC URINE PREG, ED: Preg Test, Ur: NEGATIVE

## 2013-09-09 MED ORDER — MORPHINE SULFATE 4 MG/ML IJ SOLN
4.0000 mg | Freq: Once | INTRAMUSCULAR | Status: AC
  Administered 2013-09-09: 4 mg via INTRAVENOUS
  Filled 2013-09-09: qty 1

## 2013-09-09 MED ORDER — METOCLOPRAMIDE HCL 5 MG/ML IJ SOLN
10.0000 mg | Freq: Once | INTRAMUSCULAR | Status: AC
  Administered 2013-09-09: 10 mg via INTRAVENOUS
  Filled 2013-09-09: qty 2

## 2013-09-09 MED ORDER — PROMETHAZINE HCL 25 MG RE SUPP: 25.0000 mg | Freq: Four times a day (QID) | RECTAL | Status: AC | PRN

## 2013-09-09 MED ORDER — SODIUM CHLORIDE 0.9 % IV BOLUS (SEPSIS)
1000.0000 mL | Freq: Once | INTRAVENOUS | Status: AC
  Administered 2013-09-09: 1000 mL via INTRAVENOUS

## 2013-09-09 MED ORDER — LORAZEPAM 2 MG/ML IJ SOLN
1.0000 mg | Freq: Once | INTRAMUSCULAR | Status: AC
  Administered 2013-09-09: 1 mg via INTRAVENOUS
  Filled 2013-09-09: qty 1

## 2013-09-09 MED ORDER — OXYCODONE-ACETAMINOPHEN 5-325 MG PO TABS: 2.0000 | ORAL_TABLET | ORAL | Status: DC | PRN

## 2013-09-09 MED ORDER — ONDANSETRON 4 MG PO TBDP
4.0000 mg | ORAL_TABLET | Freq: Once | ORAL | Status: AC
  Administered 2013-09-09: 4 mg via ORAL
  Filled 2013-09-09: qty 1

## 2013-09-09 NOTE — ED Notes (Signed)
Pt st's she has hx of gastroparesis and was seen here for same on Mon.  Pt st's she has continued to have abd pain with nausea and vomiting.  St's she has used Phenergan Supp and Phenergan PO but is not helping

## 2013-09-09 NOTE — ED Provider Notes (Signed)
CSN: [id number]     Arrival date & time 09/09/13  1711 History   First MD Initiated Contact with Patient 09/09/13 1910     Chief Complaint  Patient presents with  . Abdominal Pain     (Consider location/radiation/quality/duration/timing/severity/associated sxs/prior Treatment) HPI Comments: Patient is a 28 year old female with history of gastroparesis, GERD, hypertension who presents today with "a gastroparesis flare". She reports that this has been ongoing for several weeks, but worsened today. She was seen on Monday for the same symptoms and given Phenergan suppositories. The Phenergan suppositories have not been helping. She has had multiple episodes of nonbilious, nonbloody emesis. She had a sharp, burning left upper quadrant pain. This is the same pain she normally experiences with her gastroparesis. She sees Dr. Leone Herrera, but has been unable to contact him. She has had an EGD which showed GERD. She has never had a colonoscopy. She saw a naturopathic doctor yesterday who recommended she start taking colchicine.   Patient is a 28 y.o. female presenting with abdominal pain. The history is provided by the patient. No language interpreter was used.  Abdominal Pain Associated symptoms: nausea and vomiting   Associated symptoms: no chest pain, no chills, no constipation, no diarrhea, no fever and no shortness of breath     Past Medical History  Diagnosis Date  . GERD (gastroesophageal reflux disease)   . Hypertension   . Gastroparesis   . Delayed gastric emptying 04/03/2013    Gastric emptying study, [facility], a 20 01/28/2013 with 7% emptying at 1 hour and 42% at 2 hours.   Past Surgical History  Procedure Laterality Date  . Upper gi endoscopy  August 2014    Normal   Family History  Problem Relation Age of Onset  . Hypertension Mother   . Hypertension Father   . Breast cancer Maternal Grandmother     great grand  . Diabetes Maternal Grandmother   . Heart attack  Maternal Grandmother    History  Substance Use Topics  . Smoking status: Former Smoker    Types: Cigarettes    Quit date: 06/22/2011  . Smokeless tobacco: Never Used  . Alcohol Use: No   OB History   Grav Para Term Preterm Abortions TAB SAB Ect Mult Living                 Review of Systems  Constitutional: Negative for fever and chills.  Respiratory: Negative for shortness of breath.   Cardiovascular: Negative for chest pain.  Gastrointestinal: Positive for nausea, vomiting and abdominal pain. Negative for diarrhea and constipation.  All other systems reviewed and are negative.      Allergies  Cephalexin  Home Medications   Current Outpatient Rx  Name  Route  Sig  Dispense  Refill  . metoCLOPramide (REGLAN) 10 MG tablet   Oral   Take 0.5 tablets (5 mg total) by mouth 4 (four) times daily -  before meals and at bedtime.   90 tablet   3   . omeprazole (PRILOSEC) 20 MG capsule   Oral   Take 1 capsule (20 mg total) by mouth daily.   30 capsule   6   . ondansetron (ZOFRAN ODT) 4 MG disintegrating tablet   Oral   Take 1 tablet (4 mg total) by mouth 2 (two) times daily.   24 tablet   1   . promethazine (PHENERGAN) 25 MG suppository   Rectal   Place 1 suppository (25 mg total) rectally every 6 (  six) hours as needed for nausea or vomiting.   12 each   0   . promethazine (PHENERGAN) 25 MG tablet   Oral   Take 1 tablet (25 mg total) by mouth every 6 (six) hours as needed for nausea or vomiting.   50 tablet   0   . promethazine (PHENERGAN) 25 MG tablet   Oral   Take 1 tablet (25 mg total) by mouth every 6 (six) hours as needed for nausea or vomiting.   30 tablet   0   . traMADol (ULTRAM) 50 MG tablet   Oral   Take 50 mg by mouth daily as needed for moderate pain.          BP 144/88  Pulse 102  Temp(Src) 100 F (37.8 C) (Oral)  Resp 16  SpO2 97%  LMP 08/03/2013 Physical Exam  Nursing note and vitals reviewed. Constitutional: She is oriented to  person, place, and time. She appears well-developed and well-nourished.  Non-toxic appearance. She does not have a sickly appearance. She does not appear ill. No distress.  Well appearing, NAD  HENT:  Head: Normocephalic and atraumatic.  Right Ear: External ear normal.  Left Ear: External ear normal.  Nose: Nose normal.  Mouth/Throat: Oropharynx is clear and moist.  Eyes: Conjunctivae are normal.  Neck: Normal range of motion.  Cardiovascular: Normal rate, regular rhythm and normal heart sounds.   Pulmonary/Chest: Effort normal and breath sounds normal. No stridor. No respiratory distress. She has no wheezes. She has no rales.  Abdominal: Soft. Bowel sounds are normal. She exhibits no distension. There is tenderness in the left upper quadrant and left lower quadrant. There is no rigidity, no rebound and no guarding.  Musculoskeletal: Normal range of motion.  Neurological: She is alert and oriented to person, place, and time. She has normal strength.  Skin: Skin is warm and dry. She is not diaphoretic. No erythema.  Psychiatric: She has a normal mood and affect. Her behavior is normal.    ED Course  Procedures (including critical care time) Labs Review Labs Reviewed  CBC WITH DIFFERENTIAL - Abnormal; Notable for the following:    RBC 5.22 (*)    Hemoglobin 15.5 (*)    Neutrophils Relative % 88 (*)    Lymphocytes Relative 10 (*)    Monocytes Relative 2 (*)    All other components within normal limits  COMPREHENSIVE METABOLIC PANEL - Abnormal; Notable for the following:    Sodium 136 (*)    Glucose, Bld 105 (*)    All other components within normal limits  URINALYSIS, ROUTINE W REFLEX MICROSCOPIC - Abnormal; Notable for the following:    APPearance CLOUDY (*)    Glucose, UA 100 (*)    Bilirubin Urine SMALL (*)    Ketones, ur >80 (*)    Protein, ur 30 (*)    All other components within normal limits  URINE MICROSCOPIC-ADD ON - Abnormal; Notable for the following:    Squamous  Epithelial / LPF FEW (*)    Bacteria, UA MANY (*)    All other components within normal limits  URINE CULTURE  LIPASE, BLOOD  POC URINE PREG, ED   Imaging Review No results found.   EKG Interpretation None      MDM   Final diagnoses:  Gastroparesis    Patient is nontoxic, nonseptic appearing, in no apparent distress.  Patient's pain and other symptoms adequately managed in emergency department.  Fluid bolus given.  Labs, imaging and vitals  reviewed.  Patient does not meet the SIRS or Sepsis criteria.  On repeat exam patient does not have a surgical abdomen and there are no peritoneal signs.  No indication of appendicitis, bowel obstruction, bowel perforation, cholecystitis, diverticulitis, PID or ectopic pregnancy.  Patient discharged home with symptomatic treatment and given strict instructions for follow-up with their gastroenterologist.  I have also discussed reasons to return immediately to the ER.  Patient expresses understanding and agrees with plan. Discussed case with Dr. Loretha StaplerWofford who agrees with plan.        Mora BellmanHannah S Hideo Googe, PA-C 09/10/13 0302

## 2013-09-09 NOTE — ED Notes (Signed)
Patient in restroom at this time.

## 2013-09-09 NOTE — Discharge Instructions (Signed)
Gastroparesis  Gastroparesis is also called slowed stomach emptying (delayed gastric emptying). It is a condition in which the stomach takes too long to empty its contents. It often happens in people with diabetes.  CAUSES  Gastroparesis happens when nerves to the stomach are damaged or stop working. When the nerves are damaged, the muscles of the stomach and intestines do not work normally. The movement of food is slowed or stopped. High blood glucose (sugar) causes changes in nerves and can damage the blood vessels that carry oxygen and nutrients to the nerves. RISK FACTORS  Diabetes.  Post-viral syndromes.  Eating disorders (anorexia, bulimia).  Surgery on the stomach or vagus nerve.  Gastroesophageal reflux disease (rarely).  Smooth muscle disorders (amyloidosis, scleroderma).  Metabolic disorders, including hypothyroidism.  Parkinson disease. SYMPTOMS   Heartburn.  Feeling sick to your stomach (nausea).  Vomiting of undigested food.  An early feeling of fullness when eating.  Weight loss.  Abdominal bloating.  Erratic blood glucose levels.  Lack of appetite.  Gastroesophageal reflux.  Spasms of the stomach wall. Complications can include:  Bacterial overgrowth in stomach. Food stays in the stomach and can ferment and cause bacteria to grow.  Weight loss due to difficulty digesting and absorbing nutrients.  Vomiting.  Obstruction in the stomach. Undigested food can harden and cause nausea and vomiting.  Blood glucose fluctuations caused by inconsistent food absorption. DIAGNOSIS  The diagnosis of gastroparesis is confirmed through one or more of the following tests:  Barium X-rays and scans. These tests look at how long it takes for food to move through the stomach.  Gastric manometry. This test measures electrical and muscular activity in the stomach. A thin tube is passed down the throat into the stomach. The tube contains a wire that takes measurements  of the stomach's electrical and muscular activity as it digests liquids and solid food.  Endoscopy. This procedure is done with a long, thin tube called an endoscope. It is passed through the mouth and gently down the esophagus into the stomach. This tube helps the caregiver look at the lining of the stomach to check for any abnormalities.  Ultrasonography. This can rule out gallbladder disease or pancreatitis. This test will outline and define the shape of the gallbladder and pancreas. TREATMENT   Treatments may include:  Exercise.  Medicines to control nausea and vomiting.  Medicines to stimulate stomach muscles.  Changes in what and when you eat.  Having smaller meals more often.  Eating low-fiber forms of high-fiber foods, such as eating cooked vegetables instead of raw vegetables.  Eating low-fat foods.  Consuming liquids, which are easier to digest.  In severe cases, feeding tubes and intravenous (IV) feeding may be needed. It is important to note that in most cases, treatment does not cure gastroparesis. It is usually a lasting (chronic) condition. Treatment helps you manage the underlying condition so that you can be as healthy and comfortable as possible. Other treatments  A gastric neurostimulator has been developed to assist people with gastroparesis. The battery-operated device is surgically implanted. It emits mild electrical pulses to help improve stomach emptying and to control nausea and vomiting.  The use of botulinum toxin has been shown to improve stomach emptying by decreasing the prolonged contractions of the muscle between the stomach and the small intestine (pyloric sphincter). The benefits are temporary. SEEK MEDICAL CARE IF:   You have diabetes and you are having problems keeping your blood glucose in goal range.  You are having nausea,   vomiting, bloating, or early feelings of fullness with eating.  Your symptoms do not change with a change in  diet. Document Released: 05/28/2005 Document Revised: 09/22/2012 Document Reviewed: 11/04/2008 [employer] Patient Information 2014 [employer].  

## 2013-09-10 NOTE — ED Provider Notes (Signed)
Medical screening examination/treatment/procedure(s) were performed by non-physician practitioner and as supervising physician I was immediately available for consultation/collaboration.   EKG Interpretation None        Harden Bramer David Kaysie Michelini III, MD 09/10/13 1449 

## 2013-09-11 LAB — URINE CULTURE
Colony Count: NO GROWTH
Culture: NO GROWTH

## 2013-09-12 ENCOUNTER — Emergency Department (HOSPITAL_COMMUNITY)
Admission: EM | Admit: 2013-09-12 | Discharge: 2013-09-12 | Disposition: A | Discharge status: Home / Self Care | Payer: Managed Care, Other (non HMO) | Attending: Emergency Medicine | Admitting: Emergency Medicine

## 2013-09-12 ENCOUNTER — Emergency Department (HOSPITAL_COMMUNITY): Payer: Managed Care, Other (non HMO)

## 2013-09-12 ENCOUNTER — Encounter (HOSPITAL_COMMUNITY): Payer: Self-pay | Admitting: Emergency Medicine

## 2013-09-12 DIAGNOSIS — Z3202 Encounter for pregnancy test, result negative: Secondary | ICD-10-CM | POA: Insufficient documentation

## 2013-09-12 DIAGNOSIS — R109 Unspecified abdominal pain: Secondary | ICD-10-CM

## 2013-09-12 DIAGNOSIS — I10 Essential (primary) hypertension: Secondary | ICD-10-CM | POA: Insufficient documentation

## 2013-09-12 DIAGNOSIS — Z87891 Personal history of nicotine dependence: Secondary | ICD-10-CM | POA: Insufficient documentation

## 2013-09-12 DIAGNOSIS — K3184 Gastroparesis: Secondary | ICD-10-CM | POA: Insufficient documentation

## 2013-09-12 LAB — COMPREHENSIVE METABOLIC PANEL
ALK PHOS: 67 U/L (ref 39–117)
ALT: 16 U/L (ref 0–35)
AST: 21 U/L (ref 0–37)
Albumin: 4.3 g/dL (ref 3.5–5.2)
BILIRUBIN TOTAL: 0.3 mg/dL (ref 0.3–1.2)
BUN: 10 mg/dL (ref 6–23)
CHLORIDE: 99 meq/L (ref 96–112)
CO2: 25 meq/L (ref 19–32)
CREATININE: 0.71 mg/dL (ref 0.50–1.10)
Calcium: 9.3 mg/dL (ref 8.4–10.5)
GLUCOSE: 118 mg/dL — AB (ref 70–99)
POTASSIUM: 3.8 meq/L (ref 3.7–5.3)
Sodium: 139 mEq/L (ref 137–147)
Total Protein: 7.5 g/dL (ref 6.0–8.3)

## 2013-09-12 LAB — CBC WITH DIFFERENTIAL/PLATELET
Basophils Absolute: 0 10*3/uL (ref 0.0–0.1)
Basophils Relative: 0 % (ref 0–1)
Eosinophils Absolute: 0 10*3/uL (ref 0.0–0.7)
Eosinophils Relative: 0 % (ref 0–5)
HCT: 39.7 % (ref 36.0–46.0)
HEMOGLOBIN: 13.7 g/dL (ref 12.0–15.0)
LYMPHS ABS: 0.7 10*3/uL (ref 0.7–4.0)
Lymphocytes Relative: 14 % (ref 12–46)
MCH: 28.9 pg (ref 26.0–34.0)
MCHC: 34.5 g/dL (ref 30.0–36.0)
MCV: 83.8 fL (ref 78.0–100.0)
Monocytes Absolute: 0.1 10*3/uL (ref 0.1–1.0)
Monocytes Relative: 2 % — ABNORMAL LOW (ref 3–12)
NEUTROS ABS: 4.5 10*3/uL (ref 1.7–7.7)
NEUTROS PCT: 84 % — AB (ref 43–77)
Platelets: 230 10*3/uL (ref 150–400)
RBC: 4.74 MIL/uL (ref 3.87–5.11)
RDW: 12.5 % (ref 11.5–15.5)
WBC: 5.4 10*3/uL (ref 4.0–10.5)

## 2013-09-12 LAB — URINALYSIS, ROUTINE W REFLEX MICROSCOPIC
BILIRUBIN URINE: NEGATIVE
Glucose, UA: 500 mg/dL — AB
HGB URINE DIPSTICK: NEGATIVE
Leukocytes, UA: NEGATIVE
Nitrite: NEGATIVE
Protein, ur: NEGATIVE mg/dL
SPECIFIC GRAVITY, URINE: 1.034 — AB (ref 1.005–1.030)
UROBILINOGEN UA: 0.2 mg/dL (ref 0.0–1.0)
pH: 7 (ref 5.0–8.0)

## 2013-09-12 LAB — POC URINE PREG, ED: Preg Test, Ur: NEGATIVE

## 2013-09-12 LAB — LIPASE, BLOOD: LIPASE: 51 U/L (ref 11–59)

## 2013-09-12 MED ORDER — LORAZEPAM 2 MG/ML IJ SOLN
1.0000 mg | Freq: Once | INTRAMUSCULAR | Status: AC
  Administered 2013-09-12: 1 mg via INTRAVENOUS
  Filled 2013-09-12: qty 1

## 2013-09-12 MED ORDER — METOCLOPRAMIDE HCL 5 MG/ML IJ SOLN
10.0000 mg | Freq: Once | INTRAMUSCULAR | Status: AC
  Administered 2013-09-12: 10 mg via INTRAVENOUS
  Filled 2013-09-12: qty 2

## 2013-09-12 MED ORDER — FENTANYL CITRATE 0.05 MG/ML IJ SOLN
100.0000 ug | Freq: Once | INTRAMUSCULAR | Status: AC
  Administered 2013-09-12: 100 ug via INTRAVENOUS
  Filled 2013-09-12: qty 2

## 2013-09-12 MED ORDER — ONDANSETRON HCL 8 MG PO TABS: 8.0000 mg | ORAL_TABLET | ORAL | Status: DC | PRN

## 2013-09-12 MED ORDER — GI COCKTAIL ~~LOC~~
30.0000 mL | Freq: Once | ORAL | Status: AC
  Administered 2013-09-12: 30 mL via ORAL
  Filled 2013-09-12: qty 30

## 2013-09-12 MED ORDER — ONDANSETRON HCL 4 MG/2ML IJ SOLN
4.0000 mg | Freq: Once | INTRAMUSCULAR | Status: AC
  Administered 2013-09-12: 4 mg via INTRAVENOUS
  Filled 2013-09-12: qty 2

## 2013-09-12 MED ORDER — LORAZEPAM 1 MG PO TABS: 1.0000 mg | ORAL_TABLET | Freq: Four times a day (QID) | ORAL | Status: DC | PRN

## 2013-09-12 MED ORDER — SODIUM CHLORIDE 0.9 % IV BOLUS (SEPSIS)
1000.0000 mL | Freq: Once | INTRAVENOUS | Status: AC
  Administered 2013-09-12: 1000 mL via INTRAVENOUS

## 2013-09-12 NOTE — ED Provider Notes (Signed)
CSN: [id number]     Arrival date & time 09/12/13  0703 History   First MD Initiated Contact with Patient 09/12/13 [id number]     Chief Complaint  Patient presents with  . Emesis  . Abdominal Pain     (Consider location/radiation/quality/duration/timing/severity/associated sxs/prior Treatment) HPI Comments: Patient is a 28 year old female with history of GERD, hypertension, gastroparesis who presents today with persistent nausea, vomiting, abdominal pain, diarrhea. This is her third emergency department visit in the past week. She reports that she is having persistent left upper quadrant pain. It is a sharp, burning sensation. She has had non-bloody, nonbilious emesis. Her diarrhea as watery and nonbloody. She has had only a small amount of diarrhea secondary to not being able to eat. She's been using Phenergan suppositories without relief of her symptoms. She sees Dr. Leone PayorGessner. Her next appointment with him and is on Ladashia 14. From note review it appears that he has no further suggestions for the patient and is planning to refer her to another GI physician. She denies fever, chills, shortness of breath, chest pain. This pain feels like her normal pain and symptoms associated with gastroparesis.   The history is provided by the patient. No language interpreter was used.    Past Medical History  Diagnosis Date  . GERD (gastroesophageal reflux disease)   . Hypertension   . Gastroparesis   . Delayed gastric emptying 04/03/2013    [facility] 01/28/2013 with 7% emptying at 1 hour and 42% at 2 hours.   Past Surgical History  Procedure Laterality Date  . Upper gi endoscopy  August 2014    Normal   Family History  Problem Relation Age of Onset  . Hypertension Mother   . Hypertension Father   . Breast cancer Maternal Grandmother     great grand  . Diabetes Maternal Grandmother   . Heart attack Maternal Grandmother    History  Substance Use Topics  .  Smoking status: Former Smoker    Types: Cigarettes    Quit date: 06/22/2011  . Smokeless tobacco: Never Used  . Alcohol Use: No   OB History   Grav Para Term Preterm Abortions TAB SAB Ect Mult Living                 Review of Systems  Constitutional: Negative for fever and chills.  Respiratory: Negative for shortness of breath.   Cardiovascular: Negative for chest pain.  Gastrointestinal: Positive for nausea, vomiting, abdominal pain and diarrhea.  All other systems reviewed and are negative.      Allergies  Cephalexin  Home Medications   Current Outpatient Rx  Name  Route  Sig  Dispense  Refill  . oxyCODONE-acetaminophen (PERCOCET) 5-325 MG per tablet   Oral   Take 2 tablets by mouth every 4 (four) hours as needed.   20 tablet   0   . promethazine (PHENERGAN) 25 MG suppository   Rectal   Place 1 suppository (25 mg total) rectally every 6 (six) hours as needed for nausea or vomiting.   20 each   0   . promethazine (PHENERGAN) 25 MG tablet   Oral   Take 1 tablet (25 mg total) by mouth every 6 (six) hours as needed for nausea or vomiting.   30 tablet   0   . traMADol (ULTRAM) 50 MG tablet   Oral   Take 50 mg by mouth daily as needed for moderate pain.  BP 148/95  Pulse 88  Temp(Src) 99 F (37.2 C) (Oral)  Resp 18  Ht 5\' 3"  (1.6 m)  Wt 120 lb (54.432 kg)  BMI 21.26 kg/m2  SpO2 100%  LMP 08/03/2013 Physical Exam  Nursing note and vitals reviewed. Constitutional: She is oriented to person, place, and time. She appears well-developed and well-nourished. She appears distressed.  tearful  HENT:  Head: Normocephalic and atraumatic.  Right Ear: External ear normal.  Left Ear: External ear normal.  Nose: Nose normal.  Mouth/Throat: Oropharynx is clear and moist.  Eyes: Conjunctivae are normal.  Neck: Normal range of motion.  Cardiovascular: Normal rate, regular rhythm and normal heart sounds.   Pulmonary/Chest: Effort normal and breath sounds  normal. No stridor. No respiratory distress. She has no wheezes. She has no rales.  Abdominal: Soft. She exhibits no distension. There is tenderness in the left upper quadrant. There is no rigidity, no rebound and no guarding.  Musculoskeletal: Normal range of motion.  Neurological: She is alert and oriented to person, place, and time. She has normal strength.  Skin: Skin is warm and dry. She is not diaphoretic. No erythema.  Psychiatric: She has a normal mood and affect. Her behavior is normal.    ED Course  Procedures (including critical care time) Labs Review Labs Reviewed  CBC WITH DIFFERENTIAL - Abnormal; Notable for the following:    Neutrophils Relative % 84 (*)    Monocytes Relative 2 (*)    All other components within normal limits  COMPREHENSIVE METABOLIC PANEL - Abnormal; Notable for the following:    Glucose, Bld 118 (*)    All other components within normal limits  URINALYSIS, ROUTINE W REFLEX MICROSCOPIC - Abnormal; Notable for the following:    Specific Gravity, Urine 1.034 (*)    Glucose, UA 500 (*)    Ketones, ur >80 (*)    All other components within normal limits  LIPASE, BLOOD  POC URINE PREG, ED   Imaging Review Dg Abd Acute W/chest  09/12/2013   CLINICAL DATA:  Diarrhea, vomiting  EXAM: ACUTE ABDOMEN SERIES (ABDOMEN 2 VIEW & CHEST 1 VIEW)  COMPARISON:  01/18/2013  FINDINGS: Cardiomediastinal silhouette is unremarkable. No acute infiltrate or pleural effusion. No pulmonary edema. There is nonspecific nonobstructive bowel gas pattern. No free abdominal air.  IMPRESSION: Negative abdominal radiographs.  No acute cardiopulmonary disease.   Electronically Signed   By: Natasha Mead M.D.   On: 09/12/2013 12:16     EKG Interpretation None      MDM   Final diagnoses:  Gastroparesis  Abdominal pain    Patient is nontoxic, nonseptic appearing, in no apparent distress.  Patient's pain and other symptoms adequately managed in emergency department.  Fluid bolus given.   Labs, imaging and vitals reviewed.  Patient does not meet the SIRS or Sepsis criteria.  On repeat exam patient does not have a surgical abdomen and there are no peritoneal signs.  No indication of appendicitis, bowel obstruction, bowel perforation, cholecystitis, diverticulitis, PID or ectopic pregnancy.  Patient discharged home with symptomatic treatment and given strict instructions for follow-up with their gastroenterologist.  I have also discussed reasons to return immediately to the ER.  Patient expresses understanding and agrees with plan. Discussed case with Dr. Karma Ganja who agrees with plan. Patient / Family / Caregiver informed of clinical course, understand medical decision-making process, and agree with plan.   Mora Bellman, PA-C 09/12/13 1450

## 2013-09-12 NOTE — Discharge Instructions (Signed)
Abdominal Pain, Women °Abdominal (stomach, pelvic, or belly) pain can be caused by many things. It is important to tell your doctor: °· The location of the pain. °· Does it come and go or is it present all the time? °· Are there things that start the pain (eating certain foods, exercise)? °· Are there other symptoms associated with the pain (fever, nausea, vomiting, diarrhea)? °All of this is helpful to know when trying to find the cause of the pain. °CAUSES  °· Stomach: virus or bacteria infection, or ulcer. °· Intestine: appendicitis (inflamed appendix), regional ileitis (Crohn's disease), ulcerative colitis (inflamed colon), irritable bowel syndrome, diverticulitis (inflamed diverticulum of the colon), or cancer of the stomach or intestine. °· Gallbladder disease or stones in the gallbladder. °· Kidney disease, kidney stones, or infection. °· Pancreas infection or cancer. °· Fibromyalgia (pain disorder). °· Diseases of the female organs: °· Uterus: fibroid (non-cancerous) tumors or infection. °· Fallopian tubes: infection or tubal pregnancy. °· Ovary: cysts or tumors. °· Pelvic adhesions (scar tissue). °· Endometriosis (uterus lining tissue growing in the pelvis and on the pelvic organs). °· Pelvic congestion syndrome (female organs filling up with blood just before the menstrual period). °· Pain with the menstrual period. °· Pain with ovulation (producing an egg). °· Pain with an IUD (intrauterine device, birth control) in the uterus. °· Cancer of the female organs. °· Functional pain (pain not caused by a disease, may improve without treatment). °· Psychological pain. °· Depression. °DIAGNOSIS  °Your doctor will decide the seriousness of your pain by doing an examination. °· Blood tests. °· X-rays. °· Ultrasound. °· CT scan (computed tomography, special type of X-ray). °· MRI (magnetic resonance imaging). °· Cultures, for infection. °· Barium enema (dye inserted in the large intestine, to better view it with  X-rays). °· Colonoscopy (looking in intestine with a lighted tube). °· Laparoscopy (minor surgery, looking in abdomen with a lighted tube). °· Major abdominal exploratory surgery (looking in abdomen with a large incision). °TREATMENT  °The treatment will depend on the cause of the pain.  °· Many cases can be observed and treated at home. °· Over-the-counter medicines recommended by your caregiver. °· Prescription medicine. °· Antibiotics, for infection. °· Birth control pills, for painful periods or for ovulation pain. °· Hormone treatment, for endometriosis. °· Nerve blocking injections. °· Physical therapy. °· Antidepressants. °· Counseling with a psychologist or psychiatrist. °· Minor or major surgery. °HOME CARE INSTRUCTIONS  °· Do not take laxatives, unless directed by your caregiver. °· Take over-the-counter pain medicine only if ordered by your caregiver. Do not take aspirin because it can cause an upset stomach or bleeding. °· Try a clear liquid diet (broth or water) as ordered by your caregiver. Slowly move to a bland diet, as tolerated, if the pain is related to the stomach or intestine. °· Have a thermometer and take your temperature several times a day, and record it. °· Bed rest and sleep, if it helps the pain. °· Avoid sexual intercourse, if it causes pain. °· Avoid stressful situations. °· Keep your follow-up appointments and tests, as your caregiver orders. °· If the pain does not go away with medicine or surgery, you may try: °· Acupuncture. °· Relaxation exercises (yoga, meditation). °· Group therapy. °· Counseling. °SEEK MEDICAL CARE IF:  °· You notice certain foods cause stomach pain. °· Your home care treatment is not helping your pain. °· You need stronger pain medicine. °· You want your IUD removed. °· You feel faint or   lightheaded.  You develop nausea and vomiting.  You develop a rash.  You are having side effects or an allergy to your medicine. SEEK IMMEDIATE MEDICAL CARE IF:   Your  pain does not go away or gets worse.  You have a fever.  Your pain is felt only in portions of the abdomen. The right side could possibly be appendicitis. The left lower portion of the abdomen could be colitis or diverticulitis.  You are passing blood in your stools (bright red or black tarry stools, with or without vomiting).  You have blood in your urine.  You develop chills, with or without a fever.  You pass out. MAKE SURE YOU:   Understand these instructions.  Will watch your condition.  Will get help right away if you are not doing well or get worse. Document Released: 03/25/2007 Document Revised: 08/20/2011 Document Reviewed: 04/14/2009 [facility] Patient Information 2014 [place], [place].  Gastroparesis  Gastroparesis is also called slowed stomach emptying (delayed gastric emptying). It is a condition in which the stomach takes too long to empty its contents. It often happens in people with diabetes.  CAUSES  Gastroparesis happens when nerves to the stomach are damaged or stop working. When the nerves are damaged, the muscles of the stomach and intestines do not work normally. The movement of food is slowed or stopped. High blood glucose (sugar) causes changes in nerves and can damage the blood vessels that carry oxygen and nutrients to the nerves. RISK FACTORS  Diabetes.  Post-viral syndromes.  Eating disorders (anorexia, bulimia).  Surgery on the stomach or vagus nerve.  Gastroesophageal reflux disease (rarely).  Smooth muscle disorders (amyloidosis, scleroderma).  Metabolic disorders, including hypothyroidism.  Parkinson disease. SYMPTOMS   Heartburn.  Feeling sick to your stomach (nausea).  Vomiting of undigested food.  An early feeling of fullness when eating.  Weight loss.  Abdominal bloating.  Erratic blood glucose levels.  Lack of appetite.  Gastroesophageal reflux.  Spasms of the stomach wall. Complications can include:  Bacterial  overgrowth in stomach. Food stays in the stomach and can ferment and cause bacteria to grow.  Weight loss due to difficulty digesting and absorbing nutrients.  Vomiting.  Obstruction in the stomach. Undigested food can harden and cause nausea and vomiting.  Blood glucose fluctuations caused by inconsistent food absorption. DIAGNOSIS  The diagnosis of gastroparesis is confirmed through one or more of the following tests:  Barium X-rays and scans. These tests look at how long it takes for food to move through the stomach.  Gastric manometry. This test measures electrical and muscular activity in the stomach. A thin tube is passed down the throat into the stomach. The tube contains a wire that takes measurements of the stomach's electrical and muscular activity as it digests liquids and solid food.  Endoscopy. This procedure is done with a long, thin tube called an endoscope. It is passed through the mouth and gently down the esophagus into the stomach. This tube helps the caregiver look at the lining of the stomach to check for any abnormalities.  Ultrasonography. This can rule out gallbladder disease or pancreatitis. This test will outline and define the shape of the gallbladder and pancreas. TREATMENT   Treatments may include:  Exercise.  Medicines to control nausea and vomiting.  Medicines to stimulate stomach muscles.  Changes in what and when you eat.  Having smaller meals more often.  Eating low-fiber forms of high-fiber foods, such as eating cooked vegetables instead of raw vegetables.  Eating  low-fat foods.  Consuming liquids, which are easier to digest.  In severe cases, feeding tubes and intravenous (IV) feeding may be needed. It is important to note that in most cases, treatment does not cure gastroparesis. It is usually a lasting (chronic) condition. Treatment helps you manage the underlying condition so that you can be as healthy and comfortable as possible. Other  treatments  A gastric neurostimulator has been developed to assist people with gastroparesis. The battery-operated device is surgically implanted. It emits mild electrical pulses to help improve stomach emptying and to control nausea and vomiting.  The use of botulinum toxin has been shown to improve stomach emptying by decreasing the prolonged contractions of the muscle between the stomach and the small intestine (pyloric sphincter). The benefits are temporary. SEEK MEDICAL CARE IF:   You have diabetes and you are having problems keeping your blood glucose in goal range.  You are having nausea, vomiting, bloating, or early feelings of fullness with eating.  Your symptoms do not change with a change in diet. Document Released: 05/28/2005 Document Revised: 09/22/2012 Document Reviewed: 11/04/2008 [facility] Patient Information 2014 [place]

## 2013-09-12 NOTE — ED Notes (Signed)
Pt reports continues to have abdominal pain with n/v ongoing for a couple of weeks. Pt reports seen here for same recently. Pt reports no relief with prescribed medications. Pt tearful.

## 2013-09-12 NOTE — ED Provider Notes (Signed)
Medical screening examination/treatment/procedure(s) were performed by non-physician practitioner and as supervising physician I was immediately available for consultation/collaboration.   EKG Interpretation None       Hitesh Fouche K Linker, MD 09/12/13 1500 

## 2013-09-15 ENCOUNTER — Telehealth: Payer: Self-pay | Admitting: Internal Medicine

## 2013-09-15 NOTE — Telephone Encounter (Signed)
Patient advised that Dr. Leone Payorgessner has no additional recommendations and that he will see her on 4/14 and discuss [facility]

## 2013-09-22 ENCOUNTER — Ambulatory Visit: Payer: Managed Care, Other (non HMO) | Admitting: Internal Medicine

## 2013-09-23 ENCOUNTER — Telehealth: Payer: Self-pay | Admitting: Internal Medicine

## 2013-09-23 NOTE — Telephone Encounter (Signed)
Patient cancelled her appt with you and wants [facility] be [facility].  I have rescheduled her for 6/2 (I offered an appt for tomorrow she is out of town).  Please advise

## 2013-09-23 NOTE — Telephone Encounter (Signed)
[facility] re: gastroparesis help with management - any MD that will see her for this I will see her also if she wants

## 2013-09-23 NOTE — Telephone Encounter (Signed)
Patient is scheduled with Karen Herrera 10/15/13 2:45.  She is notified of the appt date and time

## 2013-10-13 ENCOUNTER — Emergency Department (HOSPITAL_COMMUNITY)
Admission: EM | Admit: 2013-10-13 | Discharge: 2013-10-13 | Disposition: A | Discharge status: Home / Self Care | Payer: Managed Care, Other (non HMO) | Attending: Emergency Medicine | Admitting: Emergency Medicine

## 2013-10-13 ENCOUNTER — Encounter (HOSPITAL_COMMUNITY): Payer: Self-pay | Admitting: Emergency Medicine

## 2013-10-13 DIAGNOSIS — R109 Unspecified abdominal pain: Secondary | ICD-10-CM

## 2013-10-13 DIAGNOSIS — Z881 Allergy status to other antibiotic agents status: Secondary | ICD-10-CM | POA: Insufficient documentation

## 2013-10-13 DIAGNOSIS — I10 Essential (primary) hypertension: Secondary | ICD-10-CM | POA: Insufficient documentation

## 2013-10-13 DIAGNOSIS — Z87891 Personal history of nicotine dependence: Secondary | ICD-10-CM | POA: Insufficient documentation

## 2013-10-13 DIAGNOSIS — R1013 Epigastric pain: Secondary | ICD-10-CM | POA: Insufficient documentation

## 2013-10-13 DIAGNOSIS — K3184 Gastroparesis: Secondary | ICD-10-CM

## 2013-10-13 DIAGNOSIS — K3189 Other diseases of stomach and duodenum: Secondary | ICD-10-CM | POA: Insufficient documentation

## 2013-10-13 DIAGNOSIS — K565 Intestinal adhesions [bands], unspecified as to partial versus complete obstruction: Secondary | ICD-10-CM | POA: Insufficient documentation

## 2013-10-13 LAB — COMPREHENSIVE METABOLIC PANEL
ALT: 17 U/L (ref 0–35)
AST: 27 U/L (ref 0–37)
Albumin: 4.4 g/dL (ref 3.5–5.2)
Alkaline Phosphatase: 68 U/L (ref 39–117)
BILIRUBIN TOTAL: 0.3 mg/dL (ref 0.3–1.2)
BUN: 6 mg/dL (ref 6–23)
CO2: 21 mEq/L (ref 19–32)
Calcium: 9.6 mg/dL (ref 8.4–10.5)
Chloride: 99 mEq/L (ref 96–112)
Creatinine, Ser: 0.71 mg/dL (ref 0.50–1.10)
GFR calc non Af Amer: >90 mL/min (ref >90)
Glucose, Bld: 128 mg/dL — ABNORMAL HIGH (ref 70–99)
Potassium: 3.9 mEq/L (ref 3.7–5.3)
Sodium: 138 mEq/L (ref 137–147)
TOTAL PROTEIN: 7.9 g/dL (ref 6.0–8.3)

## 2013-10-13 LAB — CBC WITH DIFFERENTIAL/PLATELET
BASOS ABS: 0 10*3/uL (ref 0.0–0.1)
Basophils Relative: 0 % (ref 0–1)
Eosinophils Absolute: 0 10*3/uL (ref 0.0–0.7)
Eosinophils Relative: 0 % (ref 0–5)
HEMATOCRIT: 38 % (ref 36.0–46.0)
Hemoglobin: 13.2 g/dL (ref 12.0–15.0)
LYMPHS PCT: 14 % (ref 12–46)
Lymphs Abs: 1 10*3/uL (ref 0.7–4.0)
MCH: 29 pg (ref 26.0–34.0)
MCHC: 34.7 g/dL (ref 30.0–36.0)
MCV: 83.5 fL (ref 78.0–100.0)
Monocytes Absolute: 0.3 10*3/uL (ref 0.1–1.0)
Monocytes Relative: 4 % (ref 3–12)
NEUTROS ABS: 5.8 10*3/uL (ref 1.7–7.7)
Neutrophils Relative %: 82 % — ABNORMAL HIGH (ref 43–77)
PLATELETS: 226 10*3/uL (ref 150–400)
RBC: 4.55 MIL/uL (ref 3.87–5.11)
RDW: 13 % (ref 11.5–15.5)
WBC: 7.1 10*3/uL (ref 4.0–10.5)

## 2013-10-13 LAB — LIPASE, BLOOD: Lipase: 20 U/L (ref 11–59)

## 2013-10-13 MED ORDER — SODIUM CHLORIDE 0.9 % IV SOLN
1000.0000 mL | Freq: Once | INTRAVENOUS | Status: AC
  Administered 2013-10-13: 1000 mL via INTRAVENOUS

## 2013-10-13 MED ORDER — ONDANSETRON HCL 4 MG/2ML IJ SOLN
4.0000 mg | Freq: Once | INTRAMUSCULAR | Status: AC
  Administered 2013-10-13: 4 mg via INTRAVENOUS
  Filled 2013-10-13: qty 2

## 2013-10-13 MED ORDER — OXYCODONE-ACETAMINOPHEN 5-325 MG PO TABS: 1.0000 | ORAL_TABLET | ORAL | Status: AC | PRN

## 2013-10-13 MED ORDER — KETOROLAC TROMETHAMINE 30 MG/ML IJ SOLN
30.0000 mg | Freq: Once | INTRAMUSCULAR | Status: AC
  Administered 2013-10-13: 30 mg via INTRAVENOUS
  Filled 2013-10-13: qty 1

## 2013-10-13 MED ORDER — SODIUM CHLORIDE 0.9 % IV BOLUS (SEPSIS): 1000.0000 mL | Freq: Once | INTRAVENOUS | Status: DC

## 2013-10-13 MED ORDER — LORAZEPAM 2 MG/ML IJ SOLN
1.0000 mg | Freq: Once | INTRAMUSCULAR | Status: AC
  Administered 2013-10-13: 1 mg via INTRAVENOUS
  Filled 2013-10-13: qty 1

## 2013-10-13 MED ORDER — HYDROMORPHONE HCL PF 1 MG/ML IJ SOLN
1.0000 mg | INTRAMUSCULAR | Status: AC | PRN
  Administered 2013-10-13 (×2): 1 mg via INTRAVENOUS
  Filled 2013-10-13 (×2): qty 1

## 2013-10-13 MED ORDER — METOCLOPRAMIDE HCL 5 MG/ML IJ SOLN
10.0000 mg | Freq: Once | INTRAMUSCULAR | Status: AC
  Administered 2013-10-13: 10 mg via INTRAVENOUS
  Filled 2013-10-13: qty 2

## 2013-10-13 MED ORDER — SODIUM CHLORIDE 0.9 % IV SOLN
INTRAVENOUS | Status: DC
  Administered 2013-10-13: 125 mL/h via INTRAVENOUS

## 2013-10-13 NOTE — ED Notes (Signed)
Continues to c/o nausea and abd pain-- "better than it was earlier, but it is coming back"

## 2013-10-13 NOTE — ED Notes (Addendum)
Patient requested to speak with Charge Nurse. Charge Nurse notified.

## 2013-10-13 NOTE — ED Notes (Signed)
Dr Effie Shywentz in to see pt. Pt states that her dr in [place] is not seeing her anymore and [facility].

## 2013-10-13 NOTE — Discharge Instructions (Signed)
Abdominal Pain, Adult Many things can cause abdominal pain. Usually, abdominal pain is not caused by a disease and will improve without treatment. It can often be observed and treated at home. Your health care provider will do a physical exam and possibly order blood tests and X-rays to help determine the seriousness of your pain. However, in many cases, more time must pass before a clear cause of the pain can be found. Before that point, your health care provider may not know if you need more testing or further treatment. HOME CARE INSTRUCTIONS  Monitor your abdominal pain for any changes. The following actions may help to alleviate any discomfort you are experiencing:  Only take over-the-counter or prescription medicines as directed by your health care provider.  Do not take laxatives unless directed to do so by your health care provider.  Try a clear liquid diet (broth, tea, or water) as directed by your health care provider. Slowly move to a bland diet as tolerated. SEEK MEDICAL CARE IF:  You have unexplained abdominal pain.  You have abdominal pain associated with nausea or diarrhea.  You have pain when you urinate or have a bowel movement.  You experience abdominal pain that wakes you in the night.  You have abdominal pain that is worsened or improved by eating food.  You have abdominal pain that is worsened with eating fatty foods. SEEK IMMEDIATE MEDICAL CARE IF:   Your pain does not go away within 2 hours.  You have a fever.  You keep throwing up (vomiting).  Your pain is felt only in portions of the abdomen, such as the right side or the left lower portion of the abdomen.  You pass bloody or black tarry stools. MAKE SURE YOU:  Understand these instructions.   Will watch your condition.   Will get help right away if you are not doing well or get worse.  Document Released: 03/07/2005 Document Revised: 03/18/2013 Document Reviewed: 02/04/2013 [facility] Patient  Information 2014 [place], [place].  Gastroparesis  Gastroparesis is also called slowed stomach emptying (delayed gastric emptying). It is a condition in which the stomach takes too long to empty its contents. It often happens in people with diabetes.  CAUSES  Gastroparesis happens when nerves to the stomach are damaged or stop working. When the nerves are damaged, the muscles of the stomach and intestines do not work normally. The movement of food is slowed or stopped. High blood glucose (sugar) causes changes in nerves and can damage the blood vessels that carry oxygen and nutrients to the nerves. RISK FACTORS  Diabetes.  Post-viral syndromes.  Eating disorders (anorexia, bulimia).  Surgery on the stomach or vagus nerve.  Gastroesophageal reflux disease (rarely).  Smooth muscle disorders (amyloidosis, scleroderma).  Metabolic disorders, including hypothyroidism.  Parkinson disease. SYMPTOMS   Heartburn.  Feeling sick to your stomach (nausea).  Vomiting of undigested food.  An early feeling of fullness when eating.  Weight loss.  Abdominal bloating.  Erratic blood glucose levels.  Lack of appetite.  Gastroesophageal reflux.  Spasms of the stomach wall. Complications can include:  Bacterial overgrowth in stomach. Food stays in the stomach and can ferment and cause bacteria to grow.  Weight loss due to difficulty digesting and absorbing nutrients.  Vomiting.  Obstruction in the stomach. Undigested food can harden and cause nausea and vomiting.  Blood glucose fluctuations caused by inconsistent food absorption. DIAGNOSIS  The diagnosis of gastroparesis is confirmed through one or more of the following tests:  Barium X-rays  and scans. These tests look at how long it takes for food to move through the stomach.  Gastric manometry. This test measures electrical and muscular activity in the stomach. A thin tube is passed down the throat into the stomach. The tube  contains a wire that takes measurements of the stomach's electrical and muscular activity as it digests liquids and solid food.  Endoscopy. This procedure is done with a long, thin tube called an endoscope. It is passed through the mouth and gently down the esophagus into the stomach. This tube helps the caregiver look at the lining of the stomach to check for any abnormalities.  Ultrasonography. This can rule out gallbladder disease or pancreatitis. This test will outline and define the shape of the gallbladder and pancreas. TREATMENT   Treatments may include:  Exercise.  Medicines to control nausea and vomiting.  Medicines to stimulate stomach muscles.  Changes in what and when you eat.  Having smaller meals more often.  Eating low-fiber forms of high-fiber foods, such as eating cooked vegetables instead of raw vegetables.  Eating low-fat foods.  Consuming liquids, which are easier to digest.  In severe cases, feeding tubes and intravenous (IV) feeding may be needed. It is important to note that in most cases, treatment does not cure gastroparesis. It is usually a lasting (chronic) condition. Treatment helps you manage the underlying condition so that you can be as healthy and comfortable as possible. Other treatments  A gastric neurostimulator has been developed to assist people with gastroparesis. The battery-operated device is surgically implanted. It emits mild electrical pulses to help improve stomach emptying and to control nausea and vomiting.  The use of botulinum toxin has been shown to improve stomach emptying by decreasing the prolonged contractions of the muscle between the stomach and the small intestine (pyloric sphincter). The benefits are temporary. SEEK MEDICAL CARE IF:   You have diabetes and you are having problems keeping your blood glucose in goal range.  You are having nausea, vomiting, bloating, or early feelings of fullness with eating.  Your symptoms  do not change with a change in diet. Document Released: 05/28/2005 Document Revised: 09/22/2012 Document Reviewed: 11/04/2008 [facility] Patient Information 2014 [place], [place].

## 2013-10-13 NOTE — ED Provider Notes (Signed)
The patient was seen by me, primarily  Flint MelterElliott L Anda Sobotta, MD 10/13/13 [phone]

## 2013-10-13 NOTE — ED Provider Notes (Signed)
CSN: [id number]     Arrival date & time 10/13/13  0705 History   First MD Initiated Contact with Patient 10/13/13 0800     Chief Complaint  Patient presents with  . Emesis  . Abdominal Pain     (Consider location/radiation/quality/duration/timing/severity/associated sxs/prior Treatment) Patient is a 28 y.o. female presenting with vomiting and abdominal pain. The history is provided by the patient.  Emesis Associated symptoms: abdominal pain   Abdominal Pain Associated symptoms: vomiting    She presents for evaluation of ongoing vomiting, and abdominal pain, for several days. This is a recurrent problem. She's been seen several times in the ED for the same problem. She has been seen by GI, but [facility]. This appointment is in 3 days, from now. She denies fever, chills, cough, shortness of breath, chest pain, back, pain, weakness, or dizziness. There are no other known modifying factors.  Past Medical History  Diagnosis Date  . GERD (gastroesophageal reflux disease)   . Hypertension   . Gastroparesis   . Delayed gastric emptying 04/03/2013    [facility] [place], a 20 01/28/2013 with 7% emptying at 1 hour and 42% at 2 hours.   Past Surgical History  Procedure Laterality Date  . Upper gi endoscopy  August 2014    Normal   Family History  Problem Relation Age of Onset  . Hypertension Mother   . Hypertension Father   . Breast cancer Maternal Grandmother     great grand  . Diabetes Maternal Grandmother   . Heart attack Maternal Grandmother    History  Substance Use Topics  . Smoking status: Former Smoker    Types: Cigarettes    Quit date: 06/22/2011  . Smokeless tobacco: Never Used  . Alcohol Use: No   OB History   Grav Para Term Preterm Abortions TAB SAB Ect Mult Living                 Review of Systems  Gastrointestinal: Positive for vomiting and abdominal pain.  All other systems reviewed and are  negative.     Allergies  Cephalexin  Home Medications   Prior to Admission medications   Medication Sig Start Date End Date Taking? Authorizing Provider  omeprazole (PRILOSEC) 20 MG capsule Take 20 mg by mouth daily.   Yes Historical Provider, MD  promethazine (PHENERGAN) 25 MG suppository Place 1 suppository (25 mg total) rectally every 6 (six) hours as needed for nausea or vomiting. 09/09/13  Yes Mora BellmanHannah S Merrell, PA-C  promethazine (PHENERGAN) 25 MG tablet Take 1 tablet (25 mg total) by mouth every 6 (six) hours as needed for nausea or vomiting. 09/07/13  Yes Pilar Jarvisoug Brtalik, MD   BP 117/70  Pulse 87  Temp(Src) 98.4 F (36.9 C) (Oral)  Resp 16  SpO2 99% Physical Exam  Nursing note and vitals reviewed. Constitutional: She is oriented to person, place, and time. She appears well-developed and well-nourished.  HENT:  Head: Normocephalic and atraumatic.  Right Ear: External ear normal.  Left Ear: External ear normal.  Eyes: Conjunctivae and EOM are normal. Pupils are equal, round, and reactive to light.  Neck: Normal range of motion and phonation normal. Neck supple.  Cardiovascular: Normal rate, regular rhythm, normal heart sounds and intact distal pulses.   Pulmonary/Chest: Effort normal and breath sounds normal. She exhibits no bony tenderness.  Abdominal: Soft. She exhibits no mass. There is tenderness (Epigastric, mild). There is no rebound and no  guarding.  Musculoskeletal: Normal range of motion.  Neurological: She is alert and oriented to person, place, and time. No cranial nerve deficit or sensory deficit. She exhibits normal muscle tone. Coordination normal.  Skin: Skin is warm, dry and intact.  Psychiatric: She has a normal mood and affect. Her behavior is normal. Judgment and thought content normal.    ED Course  Procedures (including critical care time) Medications  sodium chloride 0.9 % bolus 1,000 mL (not administered)  0.9 %  sodium chloride infusion ( Intravenous  Stopped 10/13/13 1333)  0.9 %  sodium chloride infusion (0 mLs Intravenous Stopped 10/13/13 1047)  ondansetron (ZOFRAN) injection 4 mg (4 mg Intravenous Given 10/13/13 0826)  ketorolac (TORADOL) 30 MG/ML injection 30 mg (30 mg Intravenous Given 10/13/13 0843)  LORazepam (ATIVAN) injection 1 mg (1 mg Intravenous Given 10/13/13 0840)  HYDROmorphone (DILAUDID) injection 1 mg (1 mg Intravenous Given 10/13/13 1239)  metoCLOPramide (REGLAN) injection 10 mg (10 mg Intravenous Given 10/13/13 1047)  ondansetron (ZOFRAN) injection 4 mg (4 mg Intravenous Given 10/13/13 1239)    Patient Vitals for the past 24 hrs:  BP Temp Temp src Pulse Resp SpO2  10/13/13 1336 117/70 mmHg - - 87 16 99 %  10/13/13 1145 104/51 mmHg - - 76 14 99 %  10/13/13 1009 167/111 mmHg - - 89 20 100 %  10/13/13 0709 159/144 mmHg 98.4 F (36.9 C) Oral 96 16 100 %    10:44 AM Reevaluation with update and discussion. After initial assessment and treatment, an updated evaluation reveals she had transient relief of pain, but now is, again, and comfortable. Findings discussed with patient and her mother, who is in the room. We decided on a plan of a dose or 2 of narcotic pain reliever prior to D/C. Flint MelterElliott L Maja Mccaffery     Labs Review Labs Reviewed  COMPREHENSIVE METABOLIC PANEL - Abnormal; Notable for the following:    Glucose, Bld 128 (*)    All other components within normal limits  CBC WITH DIFFERENTIAL - Abnormal; Notable for the following:    Neutrophils Relative % 82 (*)    All other components within normal limits  LIPASE, BLOOD    Imaging Review No results found.   EKG Interpretation None      MDM   Final diagnoses:  Gastroparesis  Abdominal pain    Ongoing gastroparesis with abdominal pain, treated with improvement in emergency department. She is discharged in a clinically stable state. Family members were able to arrange followup with her new GI doctor, for tomorrow   Nursing Notes Reviewed/ Care Coordinated Applicable  Imaging Reviewed Interpretation of Laboratory Data incorporated into ED treatment  The patient appears reasonably screened and/or stabilized for discharge and I doubt any other medical condition or other [facility] requiring further screening, evaluation, or treatment in the ED at this time prior to discharge.  Plan: Home Medications- Percocet; Home Treatments- rest, gradually advance diet; return here if the recommended treatment, does not improve the symptoms; Recommended follow up- GI as scheduled    Flint MelterElliott L Jaxton Casale, MD 10/13/13 [phone]

## 2013-10-13 NOTE — ED Notes (Signed)
PA at bedside introduced self patient immediately stated to PA does not want to be seen by her with raised voice.

## 2013-10-13 NOTE — ED Notes (Signed)
Pt reports ongoing emesis for past week. Generalized abdominal pain. Hx of gastroparesis.

## 2013-10-13 NOTE — ED Notes (Signed)
Charge Nurse at bedside.

## 2013-10-13 NOTE — ED Provider Notes (Signed)
I signed up for pt and went in to see her, have not even began to ask her questions, pt stated she did not want to see me because she has seen me before.   Lottie Musselatyana A Gleb Mcguire, PA-C 10/13/13 [phone]

## 2013-10-13 NOTE — ED Notes (Signed)
Karen Balzarineatiana, PA went in to room to examine pt, pt stated "I remember you and you are mean and I don't want to see you" -- Dr. Effie ShyWentz made aware. Pt continues to moan and shake bed railings

## 2013-10-13 NOTE — ED Notes (Signed)
EDP at bedside  

## 2013-11-10 ENCOUNTER — Ambulatory Visit: Payer: Managed Care, Other (non HMO) | Admitting: Internal Medicine

## 2013-12-04 IMAGING — US US ABDOMEN COMPLETE
1 series · 14 of 25 positions shown · non-contrast
Comparison: CT abdomen 01/04/2013

CLINICAL DATA: Abdominal pain

ABDOMINAL ULTRASOUND COMPLETE

[Series 1: us abdomen complete · 0.22mm/px · 14 of 94 slices shown]
[im 1/94]
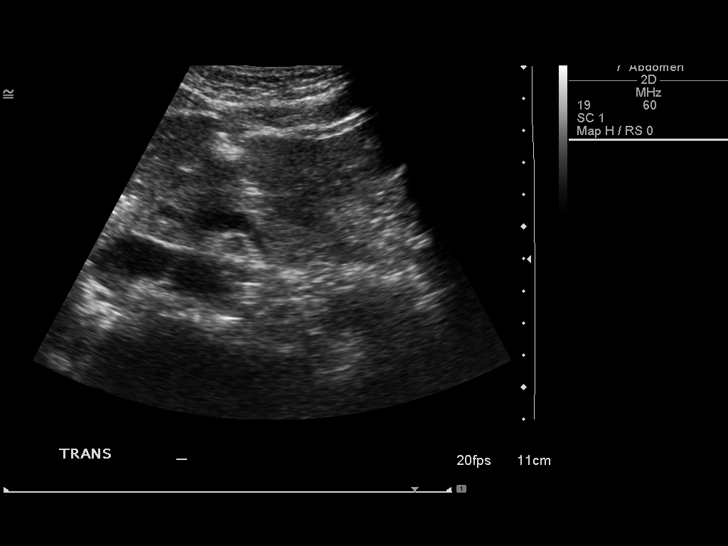
[im 8/94]
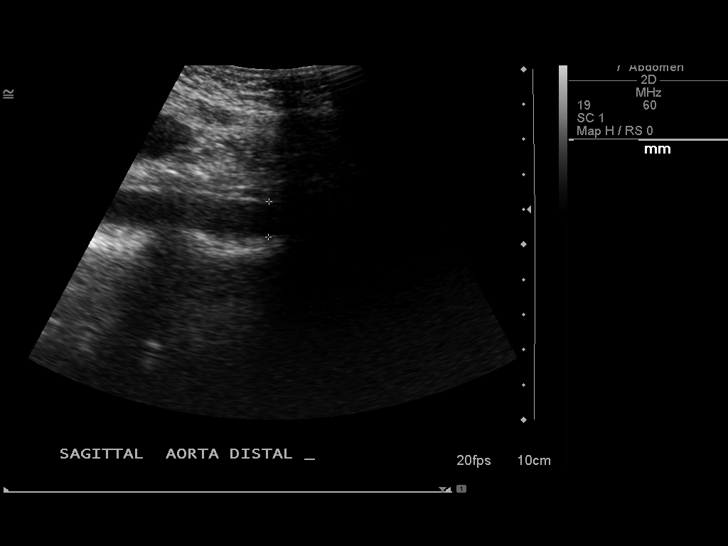
[im 16/94]
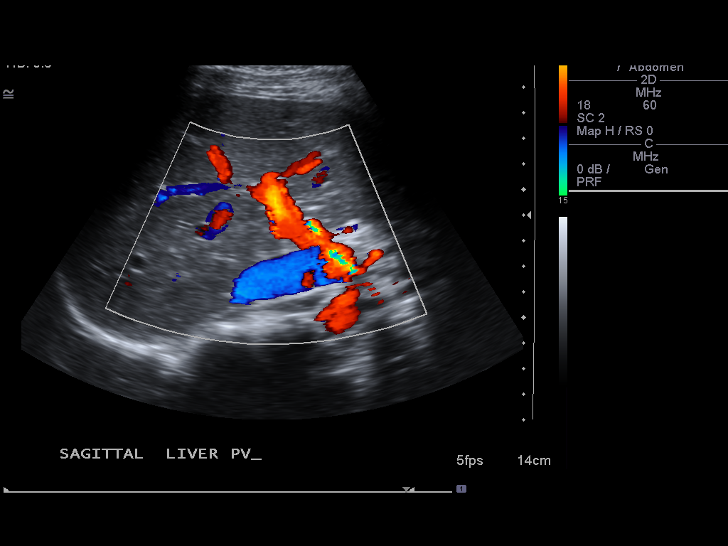
[im 24/94]
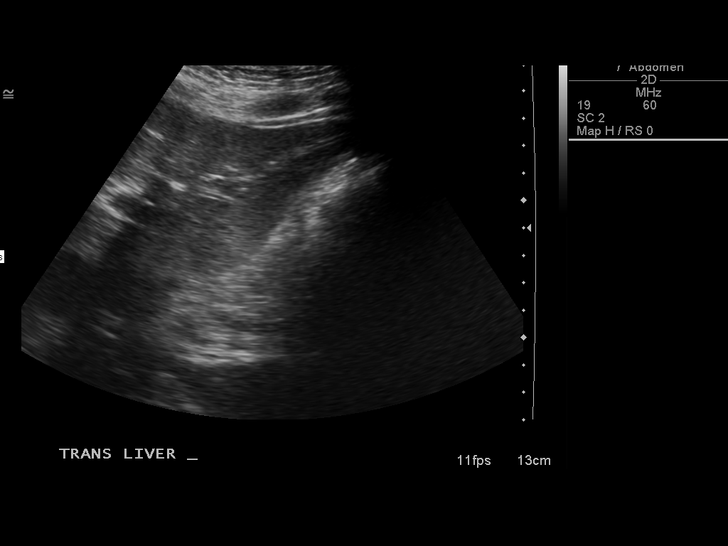
[im 32/94]
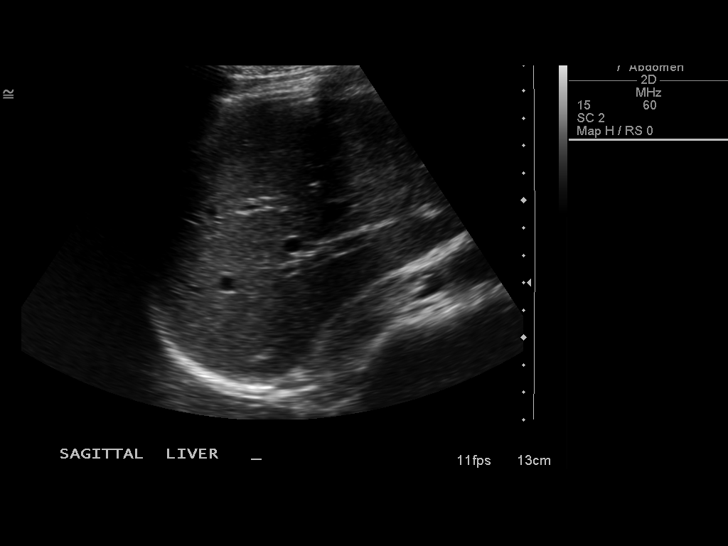
[im 35/94]
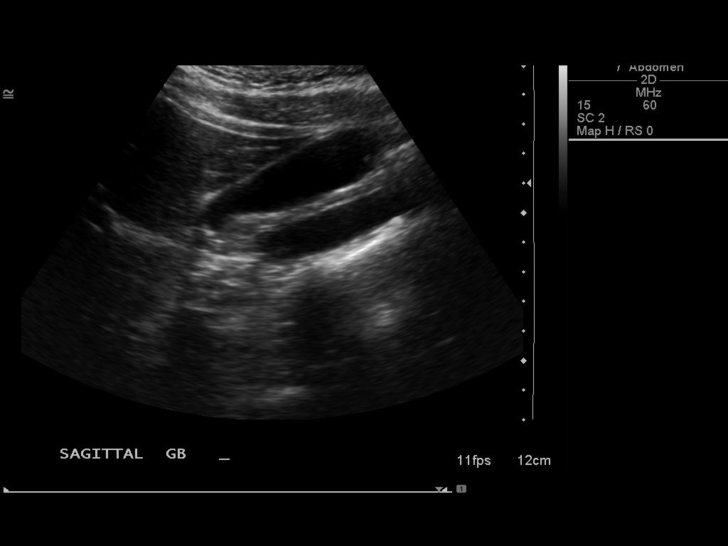
[im 43/94]
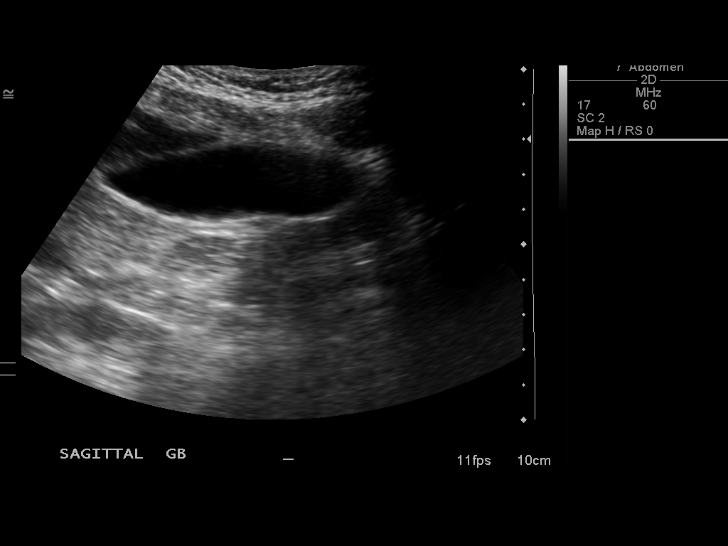
[im 51/94]
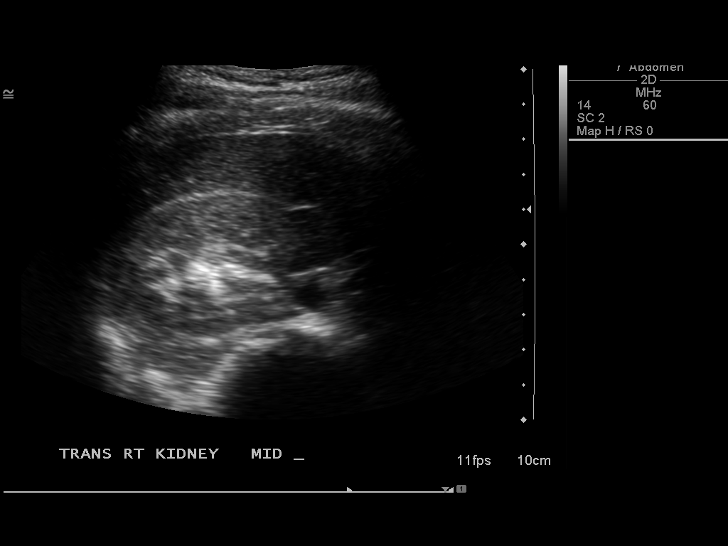
[im 59/94]
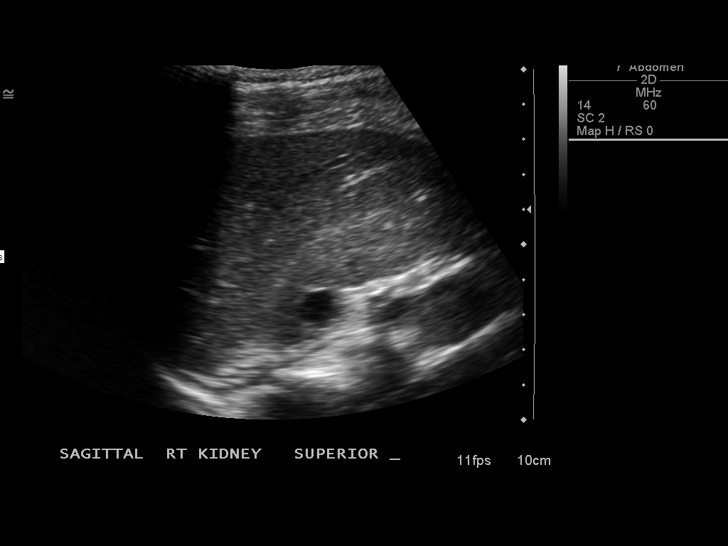
[im 63/94]
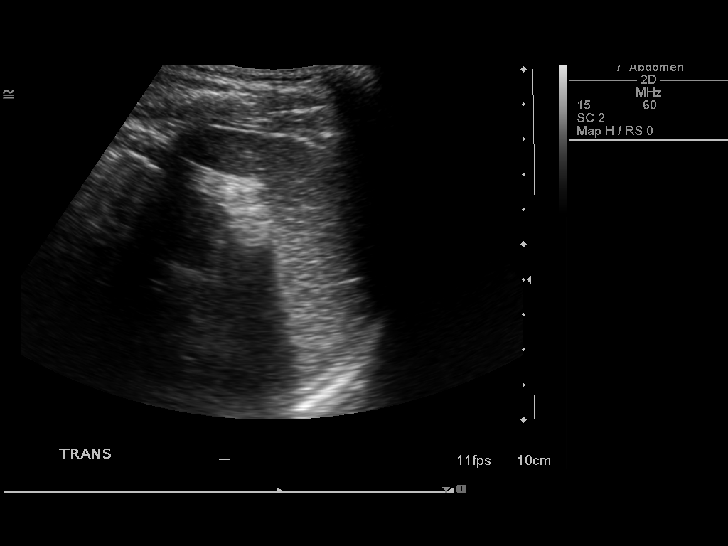
[im 70/94]
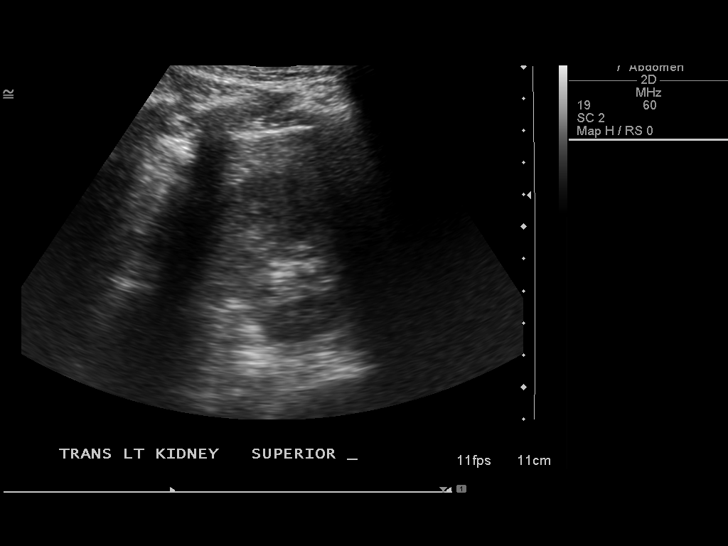
[im 78/94]
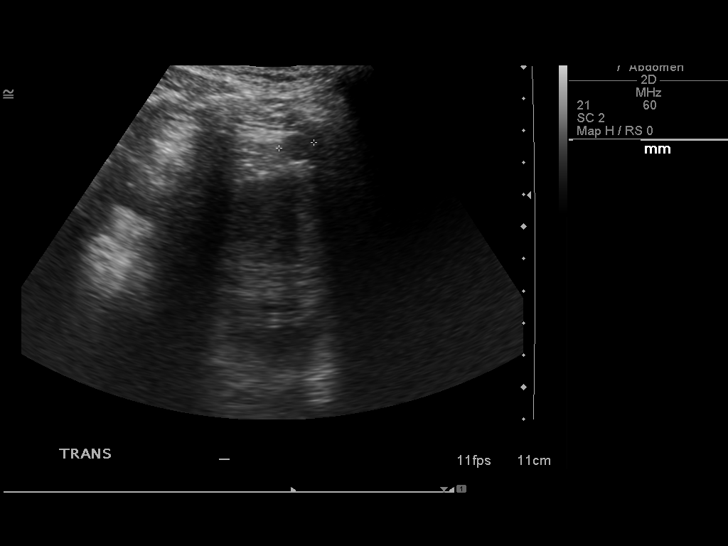
[im 86/94]
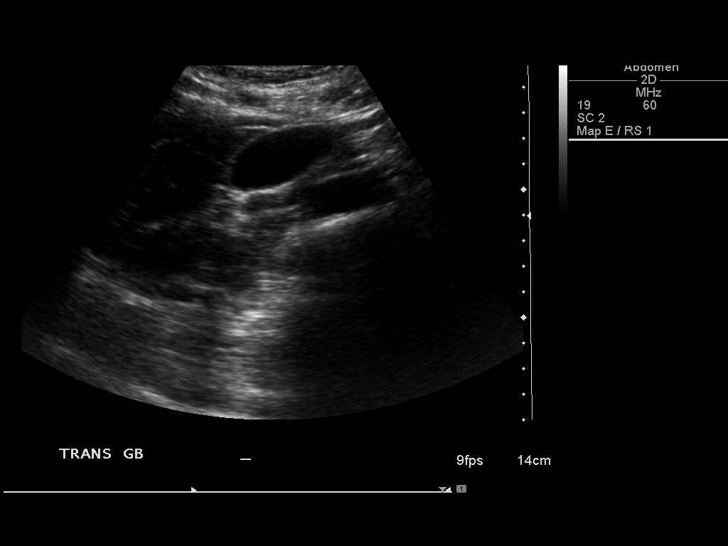
[im 94/94]
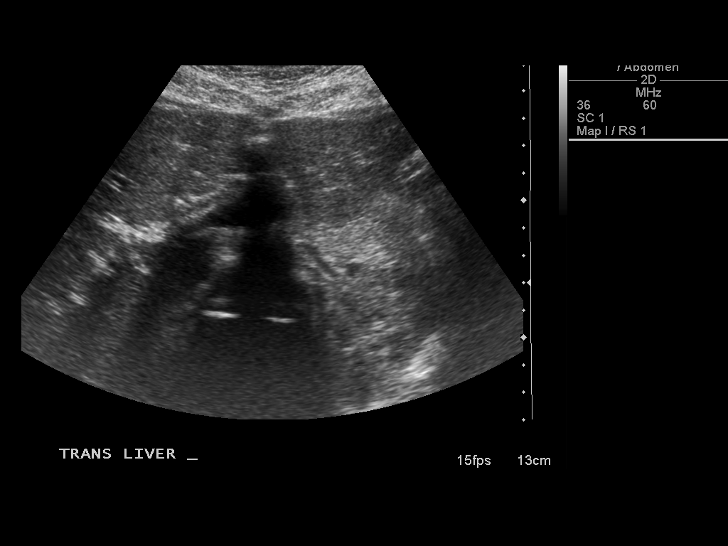

[14 of 25 positions shown; findings below may reference images not displayed]

FINDINGS: Gallbladder:  No gallstones, gallbladder wall thickening, or
pericholecystic fluid.

Common Bile Duct:  Within normal limits in caliber.

Liver: The right lobe is homogeneous without mass.  The left lobe
is heterogeneous and nodular in echotexture.  Diffuse infiltration
is not excluded.

IVC:  Appears normal.

Pancreas:  No abnormality identified.

Spleen:  Within normal limits in size and echotexture.

Right kidney:  Normal in size and parenchymal echogenicity.  No
evidence of mass or hydronephrosis. 1.2 cm simple cyst in the upper
pole.

Left kidney:  Normal in size and parenchymal echogenicity.  No
evidence of mass or hydronephrosis.

Abdominal Aorta:  No aneurysm identified.
IMPRESSION: Left lobe of the liver has a nodular echogenic pattern.  Diffuse
infiltration is not excluded.  Correlate clinically and with a lap
tests as for the need for MRI.

## 2014-07-29 IMAGING — CR DG ABDOMEN ACUTE W/ 1V CHEST
3 series · 3 of 3 positions shown · non-contrast
Comparison: 01/18/2013

CLINICAL DATA: Diarrhea, vomiting

EXAM:
ACUTE ABDOMEN SERIES (ABDOMEN 2 VIEW & CHEST 1 VIEW)

[w chest pa]
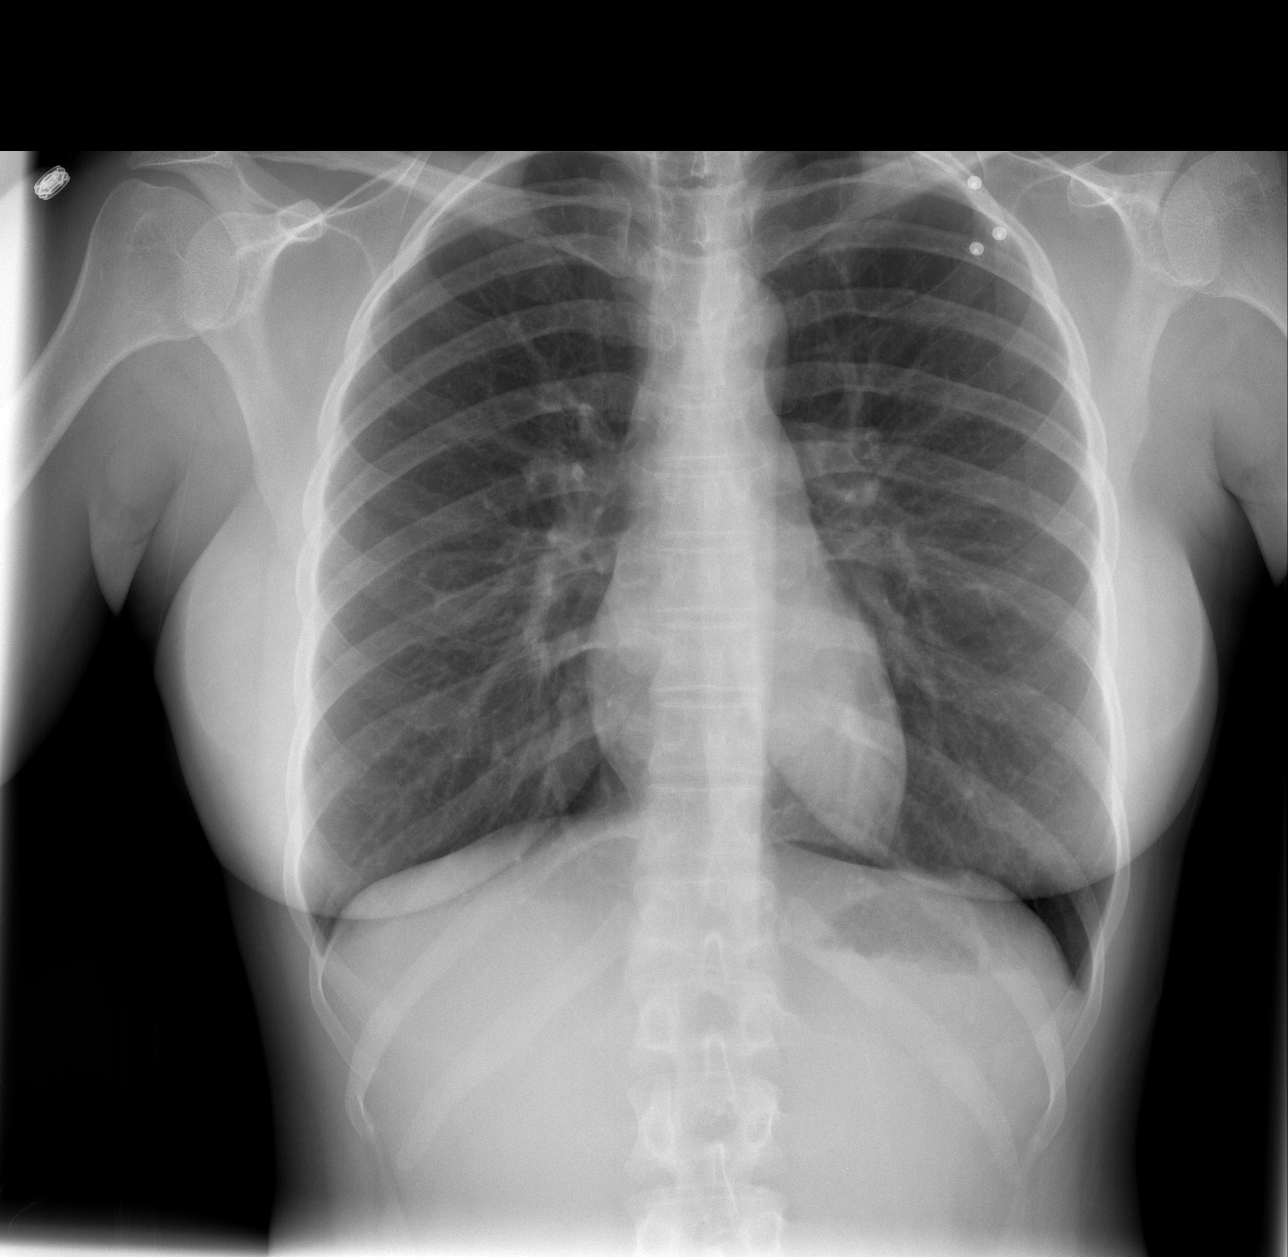

[w abdomen upright]
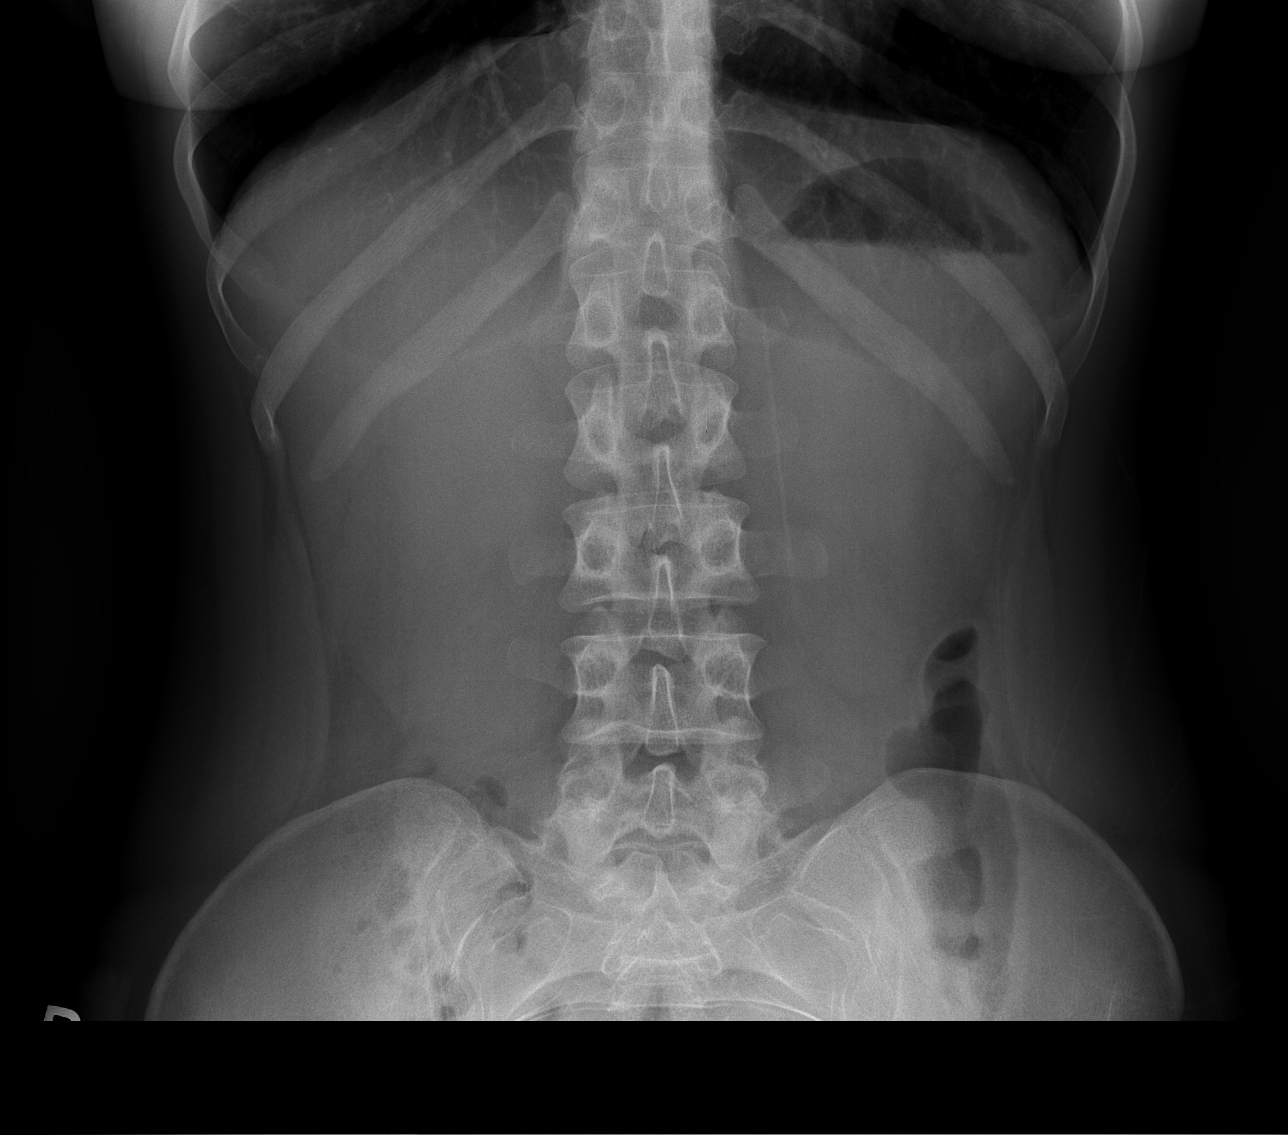

[t abdomen supine]
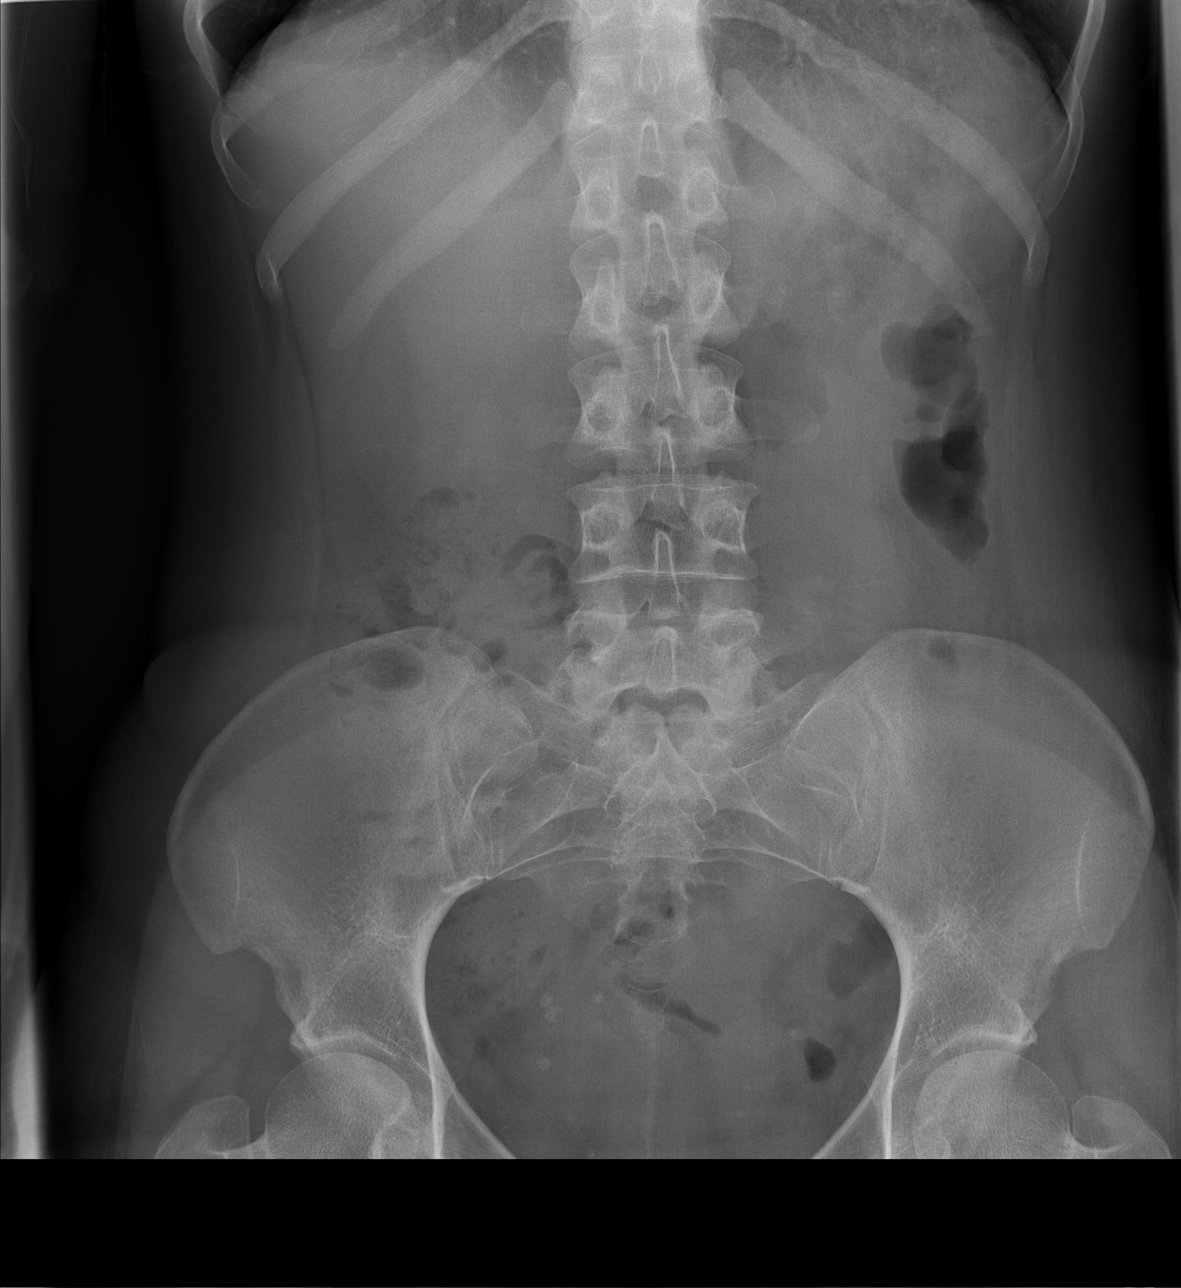

[3 of 3 positions shown; findings below may reference images not displayed]

FINDINGS: Cardiomediastinal silhouette is unremarkable. No acute infiltrate or
pleural effusion. No pulmonary edema. There is nonspecific
nonobstructive bowel gas pattern. No free abdominal air.
IMPRESSION: Negative abdominal radiographs.  No acute cardiopulmonary disease.

## 2017-10-17 ENCOUNTER — Ambulatory Visit
Admit: 2017-10-17 | Discharge: 2017-10-17 | Payer: PRIVATE HEALTH INSURANCE | Attending: Specialist | Primary: Internal Medicine

## 2017-10-17 DIAGNOSIS — R112 Nausea with vomiting, unspecified: Secondary | ICD-10-CM

## 2017-10-17 NOTE — Consults (Signed)
[facility]  CONSULTATION    Name:  Cheyenne Garrett, Cheyenne Garrett  MR#:   [id number]  DOB:  May 27, 1986  ACCOUNT #:  [id number]  DATE OF SERVICE:  10/17/2017    GASTROINTESTINAL CONSULTATION    Patient of Dr. Jarvis Morgan.    CHIEF COMPLAINT:  Nausea, vomiting, diarrhea, and abdominal pain.    HISTORY OF PRESENT ILLNESS:  The patient is a 32 year old African-American lady who had been having these symptoms of abdominal pain, diarrhea, nausea, vomiting since 2014 and has been to different gastroenterologists and had all the tests done and also been to emergency rooms and has seen the psychiatrist for her symptoms and not been getting better.  She is actually very depressed when she has tried all the proton pump inhibitors, Prevacid, Nexium, Prilosec, and also she has been taking Imodium for her diarrhea.  The diarrhea which she called it is soft stool, not liquid stool, but three bowel movements early in the morning.    She has been seeing the psychiatrist also and has been put on amitriptyline which has been switched to  Xanax.    She has a postgraduate doctor degree and in the process of completing it.    She is very depressed.    She does not smoke cigarette, does not drink alcoholic beverages.  Does not use any drugs.    The symptoms get worse around her period time, and she is very frustrated and wants to get help.  She has tried all different physicians.    ALLERGIES:  ACCORDING TO THE RECORDS, SHE IS ALLERGIC TO CEPHALEXIN, WHICH IS KEFLEX.    REVIEW OF SYSTEMS:  She has been losing weight and she is depressed and very worried for her health.  She has tried everything and has all these GI symptoms as well, as just stated above.    PHYSICAL EXAMINATION:  GENERAL:  This is a young lady.  VITAL SIGNS:  Weight is only 122 pounds and 6.4 ounces.  BMI 21.68.  Blood pressure 106/71, pulse is 69, respiration is 18, temperature 98.7.  HEENT:  Head is normocephalic.  Sclerae are nonicteric.  NECK:  Supple.  LUNGS:  Clear.  No  rales or wheezing.  HEART:  Regular rhythm.  ABDOMEN:  Soft.  Positive bowel sounds.  Nontender.  EXTREMITIES:  No edema.    IMPRESSION:  Abdominal pain, nausea, and vomiting, worst during her menstrual period time and also diarrhea, which she called the three soft bowel movements early in the morning, but she has not been able to empty herself completely all the times.    She was being followed by Dr. Eual Fines, and she cannot see him until July, which she is more worried, so I have discussed in  detail with her regarding this irritable bowel syndrome and also her symptoms of nausea and vomiting, and I am going to try her on Librax one capsule twice a day and also amitriptyline I have given her a prescription, she can take 25 mg twice a day.    The Zofran 4 mg every 8 hours, but I have told her that particularly before she starts having menstrual period, she should stop taking Zofran around-the-clock and start it p.r.n. and also continue the amitriptyline until she sees the psychiatrist.    She can take omeprazole also every day.    Diarrhea.  She should take Metamucil and high fibers because she has to empty herself, and she is just going to bathroom, but she has not  been emptying.  So I spent a lot of time in discussing all these things, and I think lot more has to go with her somatic complaints, these are all because of her depression and she needs to be seen by the psychiatrist and follow regularly, and she can come back and seen me as needed.  Otherwise, she is going to go and see Dr. Eual Fines in [facility].      Arbie Cookey, MD      [id number]  D:  10/17/2017 14:51  T:  10/17/2017 19:24  JOB #:  [id number]  CC:  Jarvis Morgan

## 2017-10-17 NOTE — Consults (Signed)
[facility]  CONSULTATION    Name:  Cheyenne Garrett, Cheyenne Garrett  MR#:   [id number]  DOB:  03-12-1986  ACCOUNT #:  [id number]  DATE OF SERVICE:  10/17/2017    GASTROINTESTINAL CONSULTATION    Patient of Dr. Jarvis Morgan.    CHIEF COMPLAINT:  Nausea, vomiting, diarrhea, and abdominal pain.    HISTORY OF PRESENT ILLNESS:  The patient is a 32 year old African-American lady who had been having these symptoms of abdominal pain, diarrhea, nausea, vomiting since 2014 and has been to different gastroenterologists and had all the tests done and also been to emergency rooms and has seen the psychiatrist for her symptoms and not been getting better.  She is actually very depressed when she has tried all the proton pump inhibitors, Prevacid, Nexium, Prilosec, and also she has been taking Imodium for her diarrhea.  The diarrhea which she called it is soft stool, not liquid stool, but three bowel movements early in the morning.    She has been seeing the psychiatrist also and has been put on amitriptyline which has been switched to  Xanax.    She has a postgraduate doctor degree and in the process of completing it.    She is very depressed.    She does not smoke cigarette, does not drink alcoholic beverages.  Does not use any drugs.    The symptoms get worse around her period time, and she is very frustrated and wants to get help.  She has tried all different physicians.    ALLERGIES:  ACCORDING TO THE RECORDS, SHE IS ALLERGIC TO CEPHALEXIN, WHICH IS KEFLEX.    REVIEW OF SYSTEMS:  She has been losing weight and she is depressed and very worried for her health.  She has tried everything and has all these GI symptoms as well, as just stated above.    PHYSICAL EXAMINATION:  GENERAL:  This is a young lady.  VITAL SIGNS:  Weight is only 122 pounds and 6.4 ounces.  BMI 21.68.  Blood pressure 106/71, pulse is 69, respiration is 18, temperature 98.7.  HEENT:  Head is normocephalic.  Sclerae are nonicteric.  NECK:  Supple.   LUNGS:  Clear.  No rales or wheezing.  HEART:  Regular rhythm.  ABDOMEN:  Soft.  Positive bowel sounds.  Nontender.  EXTREMITIES:  No edema.    IMPRESSION:  Abdominal pain, nausea, and vomiting, worst during her menstrual period time and also diarrhea, which she called the three soft bowel movements early in the morning, but she has not been able to empty herself completely all the times.    She was being followed by Dr. Eual Fines, and she cannot see him until July, which she is more worried, so I have discussed in  detail with her regarding this irritable bowel syndrome and also her symptoms of nausea and vomiting, and I am going to try her on Librax one capsule twice a day and also amitriptyline I have given her a prescription, she can take 25 mg twice a day.    The Zofran 4 mg every 8 hours, but I have told her that particularly before she starts having menstrual period, she should stop taking Zofran around-the-clock and start it p.r.n. and also continue the amitriptyline until she sees the psychiatrist.    She can take omeprazole also every day.    Diarrhea.  She should take Metamucil and high fibers because she has to empty herself, and she is just going to bathroom, but she has not  been emptying.  So I spent a lot of time in discussing all these things, and I think lot more has to go with her somatic complaints, these are all because of her depression and she needs to be seen by the psychiatrist and follow regularly, and she can come back and seen me as needed.  Otherwise, she is going to go and see Dr. Eual Fines in [facility].      Arbie Cookey, MD      [id number]  D:  10/17/2017 14:51  T:  10/17/2017 19:24  JOB #:  [id number]  CC:  Jarvis Morgan

## 2017-10-17 NOTE — Progress Notes (Signed)
Consult dictated  Nausea, Vomiting and abdominal Pain, worst during Menstrual Periods  Diarrhea but not able to empty herself  Depression  Being followed by Dr. Eual Fines  Also had been seen by Psych  Try Librax twice a day  Amitryptaline  twice a day  Zofran  around the clock during menstrual periods  Omeprazole daily  Follow up by Dr. Eual Fines

## 2018-02-19 NOTE — Telephone Encounter (Signed)
Patient requesting medication refill.  Per Dr. Margarita Grizzle note patient needs to get prescription refill from psychiatrist.    Signed By: Cecelia Byars, LPN     February 19, 2018

## 2018-02-19 NOTE — Telephone Encounter (Signed)
Spoke with Dr. Margarita Grizzle. Sending patient consult note to PCP

## 2018-02-19 NOTE — Telephone Encounter (Signed)
Cheyenne Garrett called and stated "I have been calling and spoke with the staff regarding refill for  amitriptyline for two weeks.  And have not received the medication.  Informed patient that I will follow up with Dr. Margarita Grizzle regarding her medication.  She would like the prescription to be sent to the pharmacy.  Patient will also like her notes to be sent to her primary care physician as well.    Also stated that I will follow up with her when the information has been completed.    Signed By: Cecelia Byars, LPN     February 19, 2018

## 2018-02-19 NOTE — Telephone Encounter (Signed)
Patient call at 9;30 to request refill for amitriptyline and librax. I told patient that we would get in touch with Dr. Margarita Grizzle, but since she has not been seen in over 4 months, she might need to come in for appointment. She did not want to come in. I also asked patient if she could get rx filled from her PCP, but she said that was not an option, he preferred she go to a behavioral health specialist. In Dr. Arleta Creek last note, he referred her to a behavioral health specialist as well

## 2018-02-20 NOTE — Telephone Encounter (Signed)
Cheyenne Jonespatient called in reference to consult note being sent to Dr. Lara MulchHarrow office information was sent on 02/19/2018 @ 205PM.  Patient would like Dr. Margarita GrizzleKhokhar to call her regarding her medication that's not listed in her medication profile.  Per Dr. Margarita GrizzleKhokhar - he gave the patient a limited prescription to see if the patient has relief.  The patient needs to follow up with primary care physician to resume medication.     Patient would like Dr. Margarita GrizzleKhokhar to call her in reference to above matters.  Information given to Dr. Margarita GrizzleKhokhar to call patient.    Patient was also give [facility] number to call.    Signed By: Cecelia Byarsharlene Harrod-Owuamana, LPN     February 20, 2018

## 2018-02-20 NOTE — Telephone Encounter (Signed)
Cheyenne Garrett - No answer.  Called patient to inform her that the consult note was sent to PCP Dr. Lara MulchHarrow office @ 205pm on 02/19/2018.  Signed By: Cecelia Byarsharlene Harrod-Owuamana, LPN     February 20, 2018
